# Patient Record
Sex: Male | Born: 1980 | Hispanic: No | Smoking: Never smoker
Health system: Southern US, Community
[De-identification: ages and names within clinical notes are randomized; demographics above are authoritative.]

## PROBLEM LIST (undated history)

## (undated) DIAGNOSIS — K8020 Calculus of gallbladder without cholecystitis without obstruction: Secondary | ICD-10-CM

## (undated) DIAGNOSIS — R519 Headache, unspecified: Secondary | ICD-10-CM

## (undated) DIAGNOSIS — K219 Gastro-esophageal reflux disease without esophagitis: Secondary | ICD-10-CM

## (undated) DIAGNOSIS — F419 Anxiety disorder, unspecified: Secondary | ICD-10-CM

## (undated) DIAGNOSIS — R51 Headache: Secondary | ICD-10-CM

## (undated) DIAGNOSIS — K8010 Calculus of gallbladder with chronic cholecystitis without obstruction: Secondary | ICD-10-CM

## (undated) DIAGNOSIS — E669 Obesity, unspecified: Secondary | ICD-10-CM

## (undated) DIAGNOSIS — I10 Essential (primary) hypertension: Secondary | ICD-10-CM

## (undated) HISTORY — PX: EYE SURGERY: SHX253

## (undated) HISTORY — PX: APPENDECTOMY: SHX54

## (undated) HISTORY — DX: Essential (primary) hypertension: I10

## (undated) HISTORY — PX: HERNIA REPAIR: SHX51

---

## 2008-08-04 ENCOUNTER — Emergency Department (HOSPITAL_COMMUNITY): Admission: EM | Admit: 2008-08-04 | Discharge: 2008-08-04 | Payer: Self-pay | Admitting: *Deleted

## 2010-11-10 LAB — URINALYSIS, ROUTINE W REFLEX MICROSCOPIC
Bilirubin Urine: NEGATIVE
Ketones, ur: NEGATIVE mg/dL
Nitrite: NEGATIVE
Protein, ur: NEGATIVE mg/dL
Urobilinogen, UA: 1 mg/dL (ref 0.0–1.0)

## 2017-03-25 ENCOUNTER — Emergency Department (HOSPITAL_BASED_OUTPATIENT_CLINIC_OR_DEPARTMENT_OTHER)
Admission: EM | Admit: 2017-03-25 | Discharge: 2017-03-25 | Disposition: A | Discharge status: Home / Self Care | Payer: Self-pay | Attending: Emergency Medicine | Admitting: Emergency Medicine

## 2017-03-25 ENCOUNTER — Encounter (HOSPITAL_BASED_OUTPATIENT_CLINIC_OR_DEPARTMENT_OTHER): Payer: Self-pay | Admitting: *Deleted

## 2017-03-25 DIAGNOSIS — R300 Dysuria: Secondary | ICD-10-CM | POA: Insufficient documentation

## 2017-03-25 LAB — URINALYSIS, ROUTINE W REFLEX MICROSCOPIC
Bilirubin Urine: NEGATIVE
Glucose, UA: NEGATIVE mg/dL
Hgb urine dipstick: NEGATIVE
Ketones, ur: NEGATIVE mg/dL
LEUKOCYTES UA: NEGATIVE
Nitrite: NEGATIVE
PROTEIN: NEGATIVE mg/dL
SPECIFIC GRAVITY, URINE: 1.025 (ref 1.005–1.030)
pH: 6.5 (ref 5.0–8.0)

## 2017-03-25 NOTE — ED Provider Notes (Signed)
Emergency Department Provider Note   I have reviewed the triage vital signs and the nursing notes.   HISTORY  Chief Complaint Dysuria   HPI Cody Stevenson is a 36 y.o. male without a significant past medical history the presents to the emergency department for 3 hours of dysuria. Patient states she's had this one time before that went away after a day. This episode started one elevated interview. Is not sexually active. Does not have any change in the color of his urine or the smell of his urine. No other associated symptoms no modifying symptoms. No back pain. No history of kidney stones. He did have hematuria earlier in the day but not currently. No GI symptoms such as vomiting or diarrhea.   History reviewed. No pertinent past medical history.  There are no active problems to display for this patient.   Past Surgical History:  Procedure Laterality Date  . APPENDECTOMY    . HERNIA REPAIR        Allergies Patient has no known allergies.  No family history on file.  Social History Social History  Substance Use Topics  . Smoking status: Never Smoker  . Smokeless tobacco: Not on file  . Alcohol use No    Review of Systems  All other systems negative except as documented in the HPI. All pertinent positives and negatives as reviewed in the HPI.  ____________________________________________   PHYSICAL EXAM:  VITAL SIGNS: ED Triage Vitals  Enc Vitals Group     BP 03/25/17 1400 (!) 160/95     Pulse Rate 03/25/17 1400 96     Resp 03/25/17 1400 16     Temp 03/25/17 1400 98.7 F (37.1 C)     Temp src --      SpO2 03/25/17 1400 98 %     Weight 03/25/17 1358 (!) 310 lb (140.6 kg)     Height 03/25/17 1358 5\' 10"  (1.778 m)     Head Circumference --      Peak Flow --      Pain Score 03/25/17 1358 4     Pain Loc --      Pain Edu? --      Excl. in GC? --     Constitutional: Alert and oriented. Well appearing and in no acute distress. Eyes: Conjunctivae are  normal. PERRL. EOMI. Head: Atraumatic. Nose: No congestion/rhinnorhea. Mouth/Throat: Mucous membranes are moist.  Oropharynx non-erythematous. Neck: No stridor.  No meningeal signs.   Cardiovascular: Normal rate, regular rhythm. Good peripheral circulation. Grossly normal heart sounds.   Respiratory: Normal respiratory effort.  No retractions. Lungs CTAB. Gastrointestinal: Soft and nontender. No distention.  Musculoskeletal: No lower extremity tenderness nor edema. No gross deformities of extremities. Neurologic:  Normal speech and language. No gross focal neurologic deficits are appreciated.  Skin:  Skin is warm, dry and intact. No rash noted.   ____________________________________________   LABS (all labs ordered are listed, but only abnormal results are displayed)  Labs Reviewed  URINALYSIS, ROUTINE W REFLEX MICROSCOPIC  GC/CHLAMYDIA PROBE AMP ([place]) NOT AT [facility]   ____________________________________________   PROCEDURES  Procedure(s) performed:   Procedures   ____________________________________________   INITIAL IMPRESSION / ASSESSMENT AND PLAN / ED COURSE  Pertinent labs & imaging results that were available during my care of the patient were reviewed by me and considered in my medical decision making (see chart for details).  UA negative. Gonorrhea and chlamydia culture sent. Could also be a small kidney stone that he passed  unwillingly. Has seen a urologist before but no indication for imaging at this time. No indication for further workup either.  ____________________________________________  FINAL CLINICAL IMPRESSION(S) / ED DIAGNOSES  Final diagnoses:  Dysuria    MEDICATIONS GIVEN DURING THIS VISIT:  Medications - No data to display   NEW OUTPATIENT MEDICATIONS STARTED DURING THIS VISIT:  There are no discharge medications for this patient.   Note:  This document was prepared using Dragon voice recognition software and may include  unintentional dictation errors.    Marily MemosMesner, Dazha Kempa, MD 03/25/17 2308

## 2017-03-25 NOTE — ED Notes (Signed)
ED Provider at bedside. 

## 2017-03-25 NOTE — ED Triage Notes (Signed)
Pt c/o painful urination x 1 day 

## 2017-03-26 LAB — GC/CHLAMYDIA PROBE AMP (~~LOC~~) NOT AT ARMC
Chlamydia: NEGATIVE
NEISSERIA GONORRHEA: NEGATIVE

## 2017-09-10 ENCOUNTER — Encounter: Payer: Self-pay | Admitting: Physician Assistant

## 2017-09-10 ENCOUNTER — Other Ambulatory Visit: Payer: Self-pay

## 2017-09-10 ENCOUNTER — Ambulatory Visit (INDEPENDENT_AMBULATORY_CARE_PROVIDER_SITE_OTHER): Payer: Managed Care, Other (non HMO) | Admitting: Physician Assistant

## 2017-09-10 VITALS — BP 138/82 | HR 101 | Temp 98.0°F | Resp 18 | Ht 69.69 in | Wt 325.4 lb

## 2017-09-10 DIAGNOSIS — R030 Elevated blood-pressure reading, without diagnosis of hypertension: Secondary | ICD-10-CM | POA: Diagnosis not present

## 2017-09-10 DIAGNOSIS — R040 Epistaxis: Secondary | ICD-10-CM | POA: Diagnosis not present

## 2017-09-10 DIAGNOSIS — H6123 Impacted cerumen, bilateral: Secondary | ICD-10-CM | POA: Diagnosis not present

## 2017-09-10 MED ORDER — OMRON 7 SERIES BP MONITOR DEVI: 1.0000 | Freq: Every day | 0 refills | Status: DC

## 2017-09-10 NOTE — Progress Notes (Signed)
Bella KennedyBrian Digiacomo  MRN: [id number] DOB: [birth year]  PCP: Morrell RiddleWeber, Leronda Lewers L, PA-C  Chief Complaint  Patient presents with  . Hypertension    checking blood pressure     Subjective:  Pt presents to clinic for concerns about his BP.  He has had problems with BP in the past and when it gets high he has nose bleeds.  In the past he has been on medication but he lost weight and was able to stop his medication - he has gained all the weight back.  He checks his at pharmacies - 140s/80s but not when he feels bad or is actively having the nose bleeds.  Last nose bleed was a couple of weeks ago that started about a month prior to that he ha.s had about 4-5 in total and seems to always be from the right nare  Labs about a year ago - all good to his memory but that was before weight gain.  History is obtained by patient.  Review of Systems  Constitutional: Negative for chills and fever.  HENT: Positive for congestion (recent) and nosebleeds.   Eyes: Negative for visual disturbance.  Respiratory: Negative for cough and shortness of breath.   Cardiovascular: Negative.  Negative for chest pain, palpitations and leg swelling.  Allergic/Immunologic: Negative for environmental allergies.  Neurological: Negative for dizziness, light-headedness and headaches.    There are no active problems to display for this patient.   No current outpatient medications on file prior to visit.   No current facility-administered medications on file prior to visit.     No Known Allergies  Past Medical History:  Diagnosis Date  . 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 Smoking status: Never Smoker  . Smokeless tobacco: Never Used  Substance Use Topics  . Alcohol use: Yes    Comment: social monthly  . Drug use: No   family history includes Heart attack in his maternal  grandmother and mother; Heart disease in his maternal grandmother.     Objective:  BP 138/82   Pulse (!) 101   Temp 98 F (36.7 C) (Oral)   Resp 18   Ht 5' 9.69" (1.77 m)   Wt (!) 325 lb 6.4 oz (147.6 kg)   SpO2 95%   BMI 47.11 kg/m  Body mass index is 47.11 kg/m.  Physical Exam  Constitutional: He is oriented to person, place, and time and well-developed, well-nourished, and in no distress.  HENT:  Head: Normocephalic and atraumatic.  Right Ear: Hearing, tympanic membrane and external ear normal. A foreign body (cerumen impaction) is present.  Left Ear: Hearing, tympanic membrane and external ear normal. A foreign body (cerumen impaction) is present.  Nose: Mucosal edema (red) present. No nasal deformity or septal deviation.  Mouth/Throat: Uvula is midline, oropharynx is clear and moist and mucous membranes are normal.  Eyes: Conjunctivae are normal.  Neck: Normal range of motion.  Cardiovascular: Normal rate, regular rhythm, normal heart sounds and intact distal pulses.  Pulmonary/Chest: Effort normal and breath sounds normal. He has no wheezes.  Musculoskeletal:       Right lower leg: He exhibits no edema.       Left lower leg: He exhibits no edema.  Lymphadenopathy:       Head (right side): No tonsillar adenopathy present.  Head (left side): No tonsillar adenopathy present.    He has no cervical adenopathy.       Right: No supraclavicular adenopathy present.       Left: No supraclavicular adenopathy present.  Neurological: He is alert and oriented to person, place, and time. Gait normal.  Skin: Skin is warm and dry.  Psychiatric: Mood, memory, affect and judgment normal.    Assessment and Plan :  Elevated BP without diagnosis of hypertension - Plan: Blood Pressure Monitoring (OMRON 7 SERIES BP MONITOR) DEVI - BP currently is good.  ? His readings being high due to improper cuff size.  I also want recordings when he is feeling his BP is high.  He will get a monitor  and keep track.  He will recheck with me in a month - fasting for a CPE where we will recheck this and start medications if indicated.  He will watch his salt and try to reduce caloric intake - a lot of his meals are fast food due to his job and current lifestyle.  Bilateral impacted cerumen - d/w pt how to start treatment of this at home  Epistaxis - nasal saline and humidifer - I am not sure what is causing this at this time - but there is a possibility of environmental causes and this should help with that  Benny Lennert PA-C  Primary Care at [facility] 09/10/2017 5:49 PM

## 2017-09-10 NOTE — Patient Instructions (Addendum)
Get a BP cuff - take it when you have either nose bleeds or with the sensation that your BP is higher like when you get dizzy.  Saline spray up nose and cool mist humidifer for environmental nose bleeds   IF you received an x-ray today, you will receive an invoice from [facility]. Please contact [facility] at [phone] with questions or concerns regarding your invoice.   IF you received labwork today, you will receive an invoice from [facility]. Please contact [facility] at [phone] with questions or concerns regarding your invoice.   Our billing staff will not be able to assist you with questions regarding bills from these companies.  You will be contacted with the lab results as soon as they are available. The fastest way to get your results is to activate your My Chart account. Instructions are located on the last page of this paperwork. If you have not heard from us regarding the results in 2 weeks, please contact this office.    Hydrogen peroxide and then hot shower water to ear  Or olive oil and then heat to start to break down the wax  Earwax Buildup, Adult The ears produce a substance called earwax that helps keep bacteria out of the ear and protects the skin in the ear canal. Occasionally, earwax can build up in the ear and cause discomfort or hearing loss. What increases the risk? This condition is more likely to develop in people who:  Are male.  Are elderly.  Naturally produce more earwax.  Clean their ears often with cotton swabs.  Use earplugs often.  Use in-ear headphones often.  Wear hearing aids.  Have narrow ear canals.  Have earwax that is overly thick or sticky.  Have eczema.  Are dehydrated.  Have excess hair in the ear canal.  What are the signs or symptoms? Symptoms of this condition include:  Reduced or muffled hearing.  A feeling of fullness in the ear or feeling that the ear is plugged.  Fluid coming from the  ear.  Ear pain.  Ear itch.  Ringing in the ear.  Coughing.  An obvious piece of earwax that can be seen inside the ear canal.  How is this diagnosed? This condition may be diagnosed based on:  Your symptoms.  Your medical history.  An ear exam. During the exam, your health care provider will look into your ear with an instrument called an otoscope.  You may have tests, including a hearing test. How is this treated? This condition may be treated by:  Using ear drops to soften the earwax.  Having the earwax removed by a health care provider. The health care provider may: ? Flush the ear with water. ? Use an instrument that has a loop on the end (curette). ? Use a suction device.  Surgery to remove the wax buildup. This may be done in severe cases.  Follow these instructions at home:  Take over-the-counter and prescription medicines only as told by your health care provider.  Do not put any objects, including cotton swabs, into your ear. You can clean the opening of your ear canal with a washcloth or facial tissue.  Follow instructions from your health care provider about cleaning your ears. Do not over-clean your ears.  Drink enough fluid to keep your urine clear or pale yellow. This will help to thin the earwax.  Keep all follow-up visits as told by your health care provider. If earwax builds up in your ears often  or if you use hearing aids, consider seeing your health care provider for routine, preventive ear cleanings. Ask your health care provider how often you should schedule your cleanings.  If you have hearing aids, clean them according to instructions from the manufacturer and your health care provider. Contact a health care provider if:  You have ear pain.  You develop a fever.  You have blood, pus, or other fluid coming from your ear.  You have hearing loss.  You have ringing in your ears that does not go away.  Your symptoms do not improve with  treatment.  You feel like the room is spinning (vertigo). Summary  Earwax can build up in the ear and cause discomfort or hearing loss.  The most common symptoms of this condition include reduced or muffled hearing and a feeling of fullness in the ear or feeling that the ear is plugged.  This condition may be diagnosed based on your symptoms, your medical history, and an ear exam.  This condition may be treated by using ear drops to soften the earwax or by having the earwax removed by a health care provider.  Do not put any objects, including cotton swabs, into your ear. You can clean the opening of your ear canal with a washcloth or facial tissue. This information is not intended to replace advice given to you by your health care provider. Make sure you discuss any questions you have with your health care provider. Document Released: 08/20/2004 Document Revised: 09/23/2016 Document Reviewed: 09/23/2016 Elsevier Interactive Patient Education  [employer].

## 2017-10-08 ENCOUNTER — Encounter: Payer: Self-pay | Admitting: Physician Assistant

## 2017-10-08 ENCOUNTER — Other Ambulatory Visit: Payer: Self-pay

## 2017-10-08 ENCOUNTER — Ambulatory Visit (INDEPENDENT_AMBULATORY_CARE_PROVIDER_SITE_OTHER): Payer: Managed Care, Other (non HMO) | Admitting: Physician Assistant

## 2017-10-08 VITALS — BP 130/90 | HR 98 | Temp 97.5°F | Resp 18 | Ht 69.49 in | Wt 311.8 lb

## 2017-10-08 DIAGNOSIS — R030 Elevated blood-pressure reading, without diagnosis of hypertension: Secondary | ICD-10-CM

## 2017-10-08 DIAGNOSIS — R358 Other polyuria: Secondary | ICD-10-CM | POA: Diagnosis not present

## 2017-10-08 DIAGNOSIS — Z131 Encounter for screening for diabetes mellitus: Secondary | ICD-10-CM | POA: Diagnosis not present

## 2017-10-08 DIAGNOSIS — Z13220 Encounter for screening for lipoid disorders: Secondary | ICD-10-CM

## 2017-10-08 DIAGNOSIS — Z1329 Encounter for screening for other suspected endocrine disorder: Secondary | ICD-10-CM

## 2017-10-08 DIAGNOSIS — L83 Acanthosis nigricans: Secondary | ICD-10-CM

## 2017-10-08 DIAGNOSIS — Z130 Encounter for screening for diseases of the blood and blood-forming organs and certain disorders involving the immune mechanism: Secondary | ICD-10-CM | POA: Diagnosis not present

## 2017-10-08 DIAGNOSIS — E66813 Obesity, class 3: Secondary | ICD-10-CM

## 2017-10-08 DIAGNOSIS — Z114 Encounter for screening for human immunodeficiency virus [HIV]: Secondary | ICD-10-CM

## 2017-10-08 DIAGNOSIS — E559 Vitamin D deficiency, unspecified: Secondary | ICD-10-CM | POA: Diagnosis not present

## 2017-10-08 DIAGNOSIS — Z0000 Encounter for general adult medical examination without abnormal findings: Secondary | ICD-10-CM | POA: Diagnosis not present

## 2017-10-08 DIAGNOSIS — R21 Rash and other nonspecific skin eruption: Secondary | ICD-10-CM

## 2017-10-08 DIAGNOSIS — R3589 Other polyuria: Secondary | ICD-10-CM

## 2017-10-08 DIAGNOSIS — Z6841 Body Mass Index (BMI) 40.0 and over, adult: Secondary | ICD-10-CM

## 2017-10-08 DIAGNOSIS — Z13228 Encounter for screening for other metabolic disorders: Secondary | ICD-10-CM

## 2017-10-08 DIAGNOSIS — L819 Disorder of pigmentation, unspecified: Secondary | ICD-10-CM

## 2017-10-08 NOTE — Patient Instructions (Addendum)
Use Selsun Blue for rash on back and Lamisil cream for scaly rash on feet.   Congratulations on your great progress on your weight loss journey! Keep up the good work.    IF you received an x-ray today, you will receive an invoice from [facility]. Please contact [facility] at [phone] with questions or concerns regarding your invoice.   IF you received labwork today, you will receive an invoice from [facility]. Please contact [facility] at [phone] with questions or concerns regarding your invoice.   Our billing staff will not be able to assist you with questions regarding bills from these companies.  You will be contacted with the lab results as soon as they are available. The fastest way to get your results is to activate your My Chart account. Instructions are located on the last page of this paperwork. If you have not heard from us regarding the results in 2 weeks, please contact this office.

## 2017-10-08 NOTE — Progress Notes (Signed)
Cody Stevenson  MRN: [id number] DOB: September 07, 1980  PCP: Mancel Bale, PA-C  Subjective:  Pt presents to clinic for a CPE. Patient has lost 14 lbs in on month since last visit on 09/10/2017 due to diet and exercise changes. Patient reports a vitamin D deficiency last time he had a physical, "a couple years ago."  Last dental exam: Yesterday (10/07/2017,) patient saw Dr. Trenton Gammon. At this visit, patient had three fillings done.  Last vision exam: Patient states was about 2 years ago. Patient will update this within the next month.   Vaccinations      Tetanus:Patient states was last updated 5 years ago. Patient will get results to [place].       HIV screening: Patient cannot remember when this was last updated. Will be updated at this visit.  Typical meals for patient: Ground beef with green peas was dinner last night. A typical dinner consists of chicken fajitas, and other mexican-style dishes. Patient started a new eating plan 2 weeks ago that reduces his carbohydrate intake.   Typical beverage choices: water.  Exercises: 10 times per week for 1 hour. This increase in exercise has been implemented for 14 days.  Sleeps: 7 hrs per night and sleeping well   Patient Active Problem List   Diagnosis Date Noted  . Obesity, Class III, BMI 40-49.9 (morbid obesity) ([place]) 10/11/2017    Review of Systems  Constitutional: Positive for activity change. Negative for appetite change, chills, diaphoresis, fatigue, fever and unexpected weight change.  HENT: Positive for dental problem (Just had three fillings yesterday. Upcoming appointment for another filling and a root canal on April 17th.). Negative for congestion, ear discharge, ear pain, hearing loss, postnasal drip, rhinorrhea, sinus pressure, sinus pain, sneezing, sore throat and tinnitus.   Eyes: Positive for photophobia (Patient's norm for right eye that has had surgery. ) and visual disturbance (Diplopia in right eye after right eye surgery 20 years  ago.). Negative for pain, discharge, redness and itching.  Respiratory: Negative.  Negative for cough, chest tightness, shortness of breath and wheezing.   Cardiovascular: Negative.  Negative for chest pain and palpitations.  Gastrointestinal: Negative.  Negative for abdominal pain, anal bleeding, constipation, diarrhea, nausea and vomiting.  Endocrine: Positive for polydipsia and polyuria.  Genitourinary: Positive for frequency. Negative for difficulty urinating, dysuria, hematuria, penile swelling, scrotal swelling and urgency.  Musculoskeletal: Positive for back pain. Negative for arthralgias, gait problem, joint swelling, myalgias, neck pain and neck stiffness.  Skin: Negative.  Negative for color change, rash and wound.  Allergic/Immunologic: Positive for environmental allergies. Negative for food allergies and immunocompromised state.       Patient notices intermittent allergies since moving to [place] from [facility]. Allergy symptoms not bothering him today.  Neurological: Negative.  Negative for dizziness, tremors, weakness, light-headedness, numbness and headaches.  Psychiatric/Behavioral: Negative for decreased concentration, dysphoric mood and sleep disturbance. The patient is nervous/anxious (Patient is seeing therapist for "mild anxiety.").      Prior to Admission medications   Medication Sig Start Date End Date Taking? Authorizing Provider  Blood Pressure Monitoring (OMRON 7 SERIES BP MONITOR) DEVI 1 Device by Does not apply route daily. 09/10/17  Yes Liborio Saccente, Damaris Hippo, PA-C     No Known Allergies  Social History   Socioeconomic History  . Marital status: Divorced    Spouse name: None  . Number of children: None  . Years of education: None  . Highest education level: None  Social Needs  . Emergency planning/management officer  strain: None  . Food insecurity - worry: None  . Food insecurity - inability: None  . Transportation needs - medical: None  . Transportation needs - non-medical: None    Occupational History  . Occupation: [employer]   Tobacco Use  . Smoking status: Never Smoker  . Smokeless tobacco: Never Used  Substance and Sexual Activity  . Alcohol use: Yes    Alcohol/week: 0.6 oz    Types: 1 Standard drinks or equivalent per week    Comment: social monthly  . Drug use: No  . Sexual activity: Not Currently    Birth control/protection: None  Other Topics Concern  . 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   No children    Past Surgical History:  Procedure Laterality Date  . APPENDECTOMY    . EYE SURGERY    . HERNIA REPAIR      Family History  Problem Relation Age of Onset  . Heart attack Mother        died 77  . Obesity Brother   . Heart disease Maternal Grandmother   . Heart attack Maternal Grandmother      Objective:  BP 130/90   Pulse 98   Temp (!) 97.5 F (36.4 C) (Oral)   Resp 18   Ht 5' 9.49" (1.765 m)   Wt (!) 311 lb 12.8 oz (141.4 kg)   SpO2 96%   BMI 45.40 kg/m   Physical Exam  Constitutional: He is oriented to person, place, and time and well-developed, well-nourished, and in no distress.  BP (!) 132/94   Pulse 98   Temp (!) 97.5 F (36.4 C) (Oral)   Resp 18   Ht 5' 9.49" (1.765 m)   Wt (!) 311 lb 12.8 oz (141.4 kg)   SpO2 96%   BMI 45.40 kg/m   HENT:  Head: Normocephalic and atraumatic.  Right Ear: External ear normal.  Left Ear: External ear normal.  Nose: Nose normal.  Mouth/Throat: Oropharynx is clear and moist.  Over production of ear wax, bilaterally.  Eyes: Conjunctivae and EOM are normal. Pupils are equal, round, and reactive to light.  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 Neck supple. No thyromegaly present.  Cardiovascular: Normal rate, regular rhythm, normal heart sounds and intact distal pulses. Exam reveals no gallop and no friction rub.  No murmur heard. Pulmonary/Chest: Effort normal and breath sounds  normal.  Abdominal: Soft. Bowel sounds are normal. He exhibits no distension and no mass. There is no tenderness. There is no rebound and no guarding.  Musculoskeletal: Normal range of motion. He exhibits no edema, tenderness or deformity.  Neurological: He is alert and oriented to person, place, and time. He has normal reflexes. Gait normal. GCS score is 15.  Skin: Skin is warm and dry. No rash noted. No erythema. No pallor.  Entire back with areas of hyper and hypopigmentation consistent with tinea versicolor. Dry, scaly skin on bottom of feet and between toes bilaterally. Dark, velvety skin around neck consistent with acanthosis nigricans.  Psychiatric: Mood, memory, affect and judgment normal.    Wt Readings from Last 3 Encounters:  10/08/17 (!) 311 lb 12.8 oz (141.4 kg)  09/10/17 (!) 325 lb 6.4 oz (147.6 kg)  03/25/17 (!) 310 lb (140.6 kg)     Visual Acuity Screening   Right eye Left eye Both eyes  Without correction:  With correction: 20/100 20/20 20/20    Assessment and Plan :  Physical exam, annual  Class 3 severe obesity due to excess calories without serious comorbidity with body mass index (BMI) of 45.0 to 49.9 in adult [facility])  Screening cholesterol level - Plan: Lipid panel  Screening for deficiency anemia - Plan: CBC with Differential/Platelet  Screening for metabolic disorder - Plan: CMP14+EGFR  Screening for thyroid disorder - Plan: TSH  Screening for diabetes mellitus - Plan: Hemoglobin A1c  Vitamin D deficiency - Plan: VITAMIN D 25 Hydroxy (Vit-D Deficiency, Fractures)  Polyuria - Plan: POCT urinalysis dipstick  Screening for HIV (human immunodeficiency virus) - Plan: HIV antibody  Acanthosis nigricans  Elevated BP without diagnosis of hypertension - Plan: Measure blood pressure  Rash - likely tinea pedis - use OTC lamisil - keep feet dry  Hypopigmentation - likely tinea versicolor- pt will get OTC selsum blue and use 2-3x/week to help  this  Obesity, Class III, BMI 40-49.9 (morbid obesity) ([place]) -continue great weight loss success  Labs will be reviewed once resulted. Appropriate anticipatory guidance given. Congratulated patient on weight loss and encouraged him to continue with the positive lifestyle changes he has made.  Follow up to be determined based on lab results.   Windell Hummingbird PA-C  [facility] at [facility] 10/11/2017 1:42 PM

## 2017-10-09 LAB — CMP14+EGFR
A/G RATIO: 1.8 (ref 1.2–2.2)
ALBUMIN: 4.9 g/dL (ref 3.5–5.5)
ALK PHOS: 101 IU/L (ref 39–117)
ALT: 54 IU/L — ABNORMAL HIGH (ref 0–44)
AST: 34 IU/L (ref 0–40)
BILIRUBIN TOTAL: 0.6 mg/dL (ref 0.0–1.2)
BUN / CREAT RATIO: 13 (ref 9–20)
BUN: 11 mg/dL (ref 6–20)
CHLORIDE: 104 mmol/L (ref 96–106)
CO2: 18 mmol/L — ABNORMAL LOW (ref 20–29)
Calcium: 9.5 mg/dL (ref 8.7–10.2)
Creatinine, Ser: 0.82 mg/dL (ref 0.76–1.27)
GFR calc non Af Amer: 112 mL/min/{1.73_m2} (ref >59)
GFR, EST AFRICAN AMERICAN: 130 mL/min/{1.73_m2} (ref >59)
GLOBULIN, TOTAL: 2.8 g/dL (ref 1.5–4.5)
Glucose: 101 mg/dL — ABNORMAL HIGH (ref 65–99)
POTASSIUM: 4.5 mmol/L (ref 3.5–5.2)
SODIUM: 142 mmol/L (ref 134–144)
TOTAL PROTEIN: 7.7 g/dL (ref 6.0–8.5)

## 2017-10-09 LAB — CBC WITH DIFFERENTIAL/PLATELET
BASOS ABS: 0 10*3/uL (ref 0.0–0.2)
BASOS: 1 %
EOS (ABSOLUTE): 0.1 10*3/uL (ref 0.0–0.4)
Eos: 2 %
HEMOGLOBIN: 16.7 g/dL (ref 13.0–17.7)
Hematocrit: 50.2 % (ref 37.5–51.0)
IMMATURE GRANULOCYTES: 0 %
Immature Grans (Abs): 0 10*3/uL (ref 0.0–0.1)
Lymphocytes Absolute: 2.2 10*3/uL (ref 0.7–3.1)
Lymphs: 39 %
MCH: 29.4 pg (ref 26.6–33.0)
MCHC: 33.3 g/dL (ref 31.5–35.7)
MCV: 88 fL (ref 79–97)
MONOCYTES: 8 %
Monocytes Absolute: 0.4 10*3/uL (ref 0.1–0.9)
NEUTROS ABS: 2.9 10*3/uL (ref 1.4–7.0)
NEUTROS PCT: 50 %
PLATELETS: 311 10*3/uL (ref 150–379)
RBC: 5.68 x10E6/uL (ref 4.14–5.80)
RDW: 14.5 % (ref 12.3–15.4)
WBC: 5.7 10*3/uL (ref 3.4–10.8)

## 2017-10-09 LAB — LIPID PANEL
CHOLESTEROL TOTAL: 145 mg/dL (ref 100–199)
Chol/HDL Ratio: 4.7 ratio (ref 0.0–5.0)
HDL: 31 mg/dL — AB (ref >39)
LDL CALC: 85 mg/dL (ref 0–99)
Triglycerides: 147 mg/dL (ref 0–149)
VLDL CHOLESTEROL CAL: 29 mg/dL (ref 5–40)

## 2017-10-09 LAB — HEMOGLOBIN A1C
Est. average glucose Bld gHb Est-mCnc: 126 mg/dL
Hgb A1c MFr Bld: 6 % — ABNORMAL HIGH (ref 4.8–5.6)

## 2017-10-09 LAB — VITAMIN D 25 HYDROXY (VIT D DEFICIENCY, FRACTURES): VIT D 25 HYDROXY: 10.8 ng/mL — AB (ref 30.0–100.0)

## 2017-10-09 LAB — TSH: TSH: 1.74 u[IU]/mL (ref 0.450–4.500)

## 2017-10-09 LAB — HIV ANTIBODY (ROUTINE TESTING W REFLEX): HIV Screen 4th Generation wRfx: NONREACTIVE

## 2017-10-11 DIAGNOSIS — R030 Elevated blood-pressure reading, without diagnosis of hypertension: Secondary | ICD-10-CM | POA: Insufficient documentation

## 2017-10-17 ENCOUNTER — Encounter: Payer: Self-pay | Admitting: Physician Assistant

## 2018-01-27 ENCOUNTER — Encounter: Payer: Self-pay | Admitting: Physician Assistant

## 2018-02-02 ENCOUNTER — Other Ambulatory Visit: Payer: Self-pay

## 2018-02-02 ENCOUNTER — Ambulatory Visit (INDEPENDENT_AMBULATORY_CARE_PROVIDER_SITE_OTHER): Payer: Managed Care, Other (non HMO) | Admitting: Physician Assistant

## 2018-02-02 ENCOUNTER — Encounter: Payer: Self-pay | Admitting: Physician Assistant

## 2018-02-02 VITALS — BP 130/80 | HR 109 | Temp 99.2°F | Resp 18 | Ht 69.49 in | Wt 314.4 lb

## 2018-02-02 DIAGNOSIS — K644 Residual hemorrhoidal skin tags: Secondary | ICD-10-CM | POA: Diagnosis not present

## 2018-02-02 DIAGNOSIS — R030 Elevated blood-pressure reading, without diagnosis of hypertension: Secondary | ICD-10-CM

## 2018-02-02 MED ORDER — HYDROCORTISONE 2.5 % RE CREA: 1.0000 "application " | TOPICAL_CREAM | Freq: Two times a day (BID) | RECTAL | 1 refills | Status: DC

## 2018-02-02 NOTE — Progress Notes (Signed)
Cody KennedyBrian Sobalvarro  MRN: [id number] DOB: Feb 12, 1981  PCP: Morrell RiddleWeber, Sarah L, PA-C  Chief Complaint  Patient presents with  . Rectal Problems    having pain around rectal area but not when using the bathroom     Subjective:  Pt presents to clinic for rectal pain 3 weeks that has not been changed.  The pain occurs when he wipes at the most posterior aspect of his anus.  He gets pain sometimes with sitting.  He has no pain with passing a stool.  Today he noticed after passing a stool some small blood on the tissue.  He has done nothing for this. Patient has been checking his blood pressure at home and his readings have been in the high 130s over 90s  This started the day after he had an MRI on his back - he has been diagnosed with spondilolithesis.     History is obtained by patient.  Review of Systems  Gastrointestinal: Positive for anal bleeding (On toilet tissue) and rectal pain. Negative for blood in stool, constipation and diarrhea.    Patient Active Problem List   Diagnosis Date Noted  . Obesity, Class III, BMI 40-49.9 (morbid obesity) (HCC) 10/11/2017  . Elevated BP without diagnosis of hypertension 10/11/2017    Current Outpatient Medications on File Prior to Visit  Medication Sig Dispense Refill  . Blood Pressure Monitoring (OMRON 7 SERIES BP MONITOR) DEVI 1 Device by Does not apply route daily. (Patient not taking: Reported on 02/02/2018) 1 Device 0   No current facility-administered medications on file prior to visit.     No Known Allergies  Past Medical History:  Diagnosis Date  . 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   No children   Social History   Tobacco Use  . Smoking status: Never Smoker  . Smokeless tobacco: Never Used  Substance Use Topics  . Alcohol use: Yes    Alcohol/week: 0.6 oz    Types: 1 Standard drinks or equivalent per week    Comment: social monthly    . Drug use: No   family history includes Heart attack in his maternal grandmother and mother; Heart disease in his maternal grandmother; Obesity in his brother.     Objective:  BP 130/80   Pulse (!) 109   Temp 99.2 F (37.3 C) (Oral)   Resp 18   Ht 5' 9.49" (1.765 m)   Wt (!) 314 lb 6.4 oz (142.6 kg)   SpO2 95%   BMI 45.78 kg/m  Body mass index is 45.78 kg/m.  Wt Readings from Last 3 Encounters:  02/02/18 (!) 314 lb 6.4 oz (142.6 kg)  10/08/17 (!) 311 lb 12.8 oz (141.4 kg)  09/10/17 (!) 325 lb 6.4 oz (147.6 kg)    Physical Exam  Constitutional: He is oriented to person, place, and time. He appears well-developed and well-nourished.  HENT:  Head: Normocephalic and atraumatic.  Right Ear: External ear normal.  Left Ear: External ear normal.  Eyes: Conjunctivae are normal.  Neck: Normal range of motion.  Cardiovascular: Normal rate, regular rhythm and normal heart sounds.  No murmur heard. Pulmonary/Chest: Effort normal and breath sounds normal. He has no wheezes.  Abdominal: Soft.  Genitourinary: Rectal exam shows external hemorrhoid.     Neurological: He is alert and oriented to person, place, and time.  Skin: Skin is warm and dry.  Psychiatric: Judgment normal.  Vitals reviewed.   Assessment and Plan :  External hemorrhoid - Plan: hydrocortisone (ANUSOL-HC) 2.5 % rectal cream -discussed with patient hemorrhoids and I suspect this is his discomfort  Elevated BP without diagnosis of hypertension -blood pressure is better today but his home readings are still elevated he will continue home readings for a week each month until I recheck him back he will do readings in the morning of the first upon awakening and again prior to bedtime.  Patient verbalized to me that they understand the following: diagnosis, what is being done for them, what to expect and what should be done at home.  Their questions have been answered.  See after visit summary for patient specific  instructions.  Benny Lennert PA-C  [facility] at [facility] 02/02/2018 5:46 PM  Please note: Portions of this report may have been transcribed using dragon voice recognition software. Every effort was made to ensure accuracy; however, inadvertent computerized transcription errors may be present.

## 2018-02-02 NOTE — Patient Instructions (Addendum)
Hemorrhoids Hemorrhoids are swollen veins in and around the rectum or anus. There are two types of hemorrhoids:  Internal hemorrhoids. These occur in the veins that are just inside the rectum. They may poke through to the outside and become irritated and painful.  External hemorrhoids. These occur in the veins that are outside of the anus and can be felt as a painful swelling or hard lump near the anus.  Most hemorrhoids do not cause serious problems, and they can be managed with home treatments such as diet and lifestyle changes. If home treatments do not help your symptoms, procedures can be done to shrink or remove the hemorrhoids. What are the causes? This condition is caused by increased pressure in the anal area. This pressure may result from various things, including:  Constipation.  Straining to have a bowel movement.  Diarrhea.  Pregnancy.  Obesity.  Sitting for long periods of time.  Heavy lifting or other activity that causes you to strain.  Anal sex.  What are the signs or symptoms? Symptoms of this condition include:  Pain.  Anal itching or irritation.  Rectal bleeding.  Leakage of stool (feces).  Anal swelling.  One or more lumps around the anus.  How is this diagnosed? This condition can often be diagnosed through a visual exam. Other exams or tests may also be done, such as:  Examination of the rectal area with a gloved hand (digital rectal exam).  Examination of the anal canal using a small tube (anoscope).  A blood test, if you have lost a significant amount of blood.  A test to look inside the colon (sigmoidoscopy or colonoscopy).  How is this treated? This condition can usually be treated at home. However, various procedures may be done if dietary changes, lifestyle changes, and other home treatments do not help your symptoms. These procedures can help make the hemorrhoids smaller or remove them completely. Some of these procedures involve  surgery, and others do not. Common procedures include:  Rubber band ligation. Rubber bands are placed at the base of the hemorrhoids to cut off the blood supply to them.  Sclerotherapy. Medicine is injected into the hemorrhoids to shrink them.  Infrared coagulation. A type of light energy is used to get rid of the hemorrhoids.  Hemorrhoidectomy surgery. The hemorrhoids are surgically removed, and the veins that supply them are tied off.  Stapled hemorrhoidopexy surgery. A circular stapling device is used to remove the hemorrhoids and use staples to cut off the blood supply to them.  Follow these instructions at home: Eating and drinking  Eat foods that have a lot of fiber in them, such as whole grains, beans, nuts, fruits, and vegetables. Ask your health care provider about taking products that have added fiber (fiber supplements).  Drink enough fluid to keep your urine clear or pale yellow. Managing pain and swelling  Take warm sitz baths for 20 minutes, 3-4 times a day to ease pain and discomfort.  If directed, apply ice to the affected area. Using ice packs between sitz baths may be helpful. ? Put ice in a plastic bag. ? Place a towel between your skin and the bag. ? Leave the ice on for 20 minutes, 2-3 times a day. General instructions  Take over-the-counter and prescription medicines only as told by your health care provider.  Use medicated creams or suppositories as told.  Exercise regularly.  Go to the bathroom when you have the urge to have a bowel movement. Do not wait.    Avoid straining to have bowel movements.  Keep the anal area dry and clean. Use wet toilet paper or moist towelettes after a bowel movement.  Do not sit on the toilet for long periods of time. This increases blood pooling and pain. Contact a health care provider if:  You have increasing pain and swelling that are not controlled by treatment or medicine.  You have uncontrolled bleeding.  You  have difficulty having a bowel movement, or you are unable to have a bowel movement.  You have pain or inflammation outside the area of the hemorrhoids. This information is not intended to replace advice given to you by your health care provider. Make sure you discuss any questions you have with your health care provider. Document Released: 07/10/2000 Document Revised: 12/11/2015 Document Reviewed: 03/27/2015 Elsevier Interactive Patient Education  2018 [employer]     IF you received an x-ray today, you will receive an invoice from [facility]. Please contact [facility] at [phone] with questions or concerns regarding your invoice.   IF you received labwork today, you will receive an invoice from [facility]. Please contact [facility] at [phone] with questions or concerns regarding your invoice.   Our billing staff will not be able to assist you with questions regarding bills from these companies.  You will be contacted with the lab results as soon as they are available. The fastest way to get your results is to activate your My Chart account. Instructions are located on the last page of this paperwork. If you have not heard from us regarding the results in 2 weeks, please contact this office.

## 2018-03-11 ENCOUNTER — Ambulatory Visit: Payer: Self-pay

## 2018-03-11 NOTE — Telephone Encounter (Signed)
Patient called in with c/o "rectal pain/bleeding." He says "I was in the office to see Ms. Weber and she told me I have hemorrhoids. I felt pressure down there and went to the bathroom. I had a bowel movement and it was constipated. I wiped and there was blood on the tissue. I still feel like there is something there and it's painful, I can hardly sit. The pain is a 7 on the pain scale. There is itching as well." I asked about other symptoms, he denies. According to protocol, see PCP within 24 hours, no availability with PCP, appointment scheduled for tomorrow at 0820 with Dr. Alwyn RenHopper, care advice given, patient verbalized understanding.   Reason for Disposition . MODERATE-SEVERE rectal pain (i.e., interferes with school, work, or sleep)  Answer Assessment - Initial Assessment Questions 1. SYMPTOM:  "What's the main symptom you're concerned about?" (e.g., pain, itching, swelling, rash)     Rectal pain 2. ONSET: "When did the pain start?"     Worse today 3. RECTAL PAIN: "Do you have any pain around your rectum?" "How bad is the pain?"  (Scale 1-10; or mild, moderate, severe)  - MILD (1-3): doesn't interfere with normal activities   - MODERATE (4-7): interferes with normal activities or awakens from sleep, limping   - SEVERE (8-10): excruciating pain, unable to have a bowel movement      7 4. RECTAL ITCHING: "Do you have any itching in this area?" "How bad is the itching?"  (Scale 1-10; or mild, moderate, severe)  - MILD - doesn't interfere with normal activities   - MODERATE - SEVERE: interferes with normal activities or awakens from sleep     Moderate-Severe 5. CONSTIPATION: "Do you have constipation?" If so, "How bad is it?"     Yes, had BM today 6. CAUSE: "What do you think is causing the anus symptoms?"     I don't know 7. OTHER SYMPTOMS: "Do you have any other symptoms?"  (e.g., rectal bleeding, abdominal pain, vomiting, fever)     Rectal bleeding on the tissue 8. PREGNANCY: "Is there  any chance you are pregnant?" "When was your last menstrual period?"     N/A  Protocols used: [facility]

## 2018-03-12 ENCOUNTER — Encounter: Payer: Self-pay | Admitting: Family Medicine

## 2018-03-12 ENCOUNTER — Ambulatory Visit (INDEPENDENT_AMBULATORY_CARE_PROVIDER_SITE_OTHER): Payer: Managed Care, Other (non HMO) | Admitting: Family Medicine

## 2018-03-12 ENCOUNTER — Other Ambulatory Visit: Payer: Self-pay

## 2018-03-12 VITALS — BP 144/97 | HR 85 | Temp 97.9°F | Ht 70.0 in | Wt 323.8 lb

## 2018-03-12 DIAGNOSIS — K644 Residual hemorrhoidal skin tags: Secondary | ICD-10-CM | POA: Diagnosis not present

## 2018-03-12 DIAGNOSIS — K6289 Other specified diseases of anus and rectum: Secondary | ICD-10-CM | POA: Diagnosis not present

## 2018-03-12 DIAGNOSIS — K625 Hemorrhage of anus and rectum: Secondary | ICD-10-CM

## 2018-03-12 MED ORDER — HYDROCORTISONE ACETATE 25 MG RE SUPP: 25.0000 mg | Freq: Two times a day (BID) | RECTAL | 0 refills | Status: DC

## 2018-03-12 NOTE — Progress Notes (Signed)
Patient ID: Cody KennedyBrian Stevenson, male    DOB: 05/16/1981  Age: 37 y.o. MRN: [id number]  Chief Complaint  Patient presents with  . Rectal Pain    rectal pain with bleeding for the past month, using Hydrocortisone with no resolve    Subjective:    Current allergies, medications, problem list, past/family and social histories reviewed.  Objective:  BP (!) 144/97 (BP Location: Left Arm, Patient Position: Sitting, Cuff Size: Large)   Pulse 85   Temp 97.9 F (36.6 C) (Oral)   Ht 5\' 10"  (1.778 m)   Wt (!) 323 lb 12.8 oz (146.9 kg)   SpO2 95%   BMI 46.5746 kg/m   37 year old man with a history of over the last couple of months having rectal pain and bleeding.  He was previously evaluated by Cody LennertSarah Stevenson and treated with Anusol HC.  The patient has been using it a couple times a day.  He persists with bleeding with BMs.  There is blood on the surface of the stool but not enough to fill the toilet bowl with blood there is pain with defecation or contact to the anal area.  He has back issues and is started doing some physical therapy with a little bit of straining but not heavy weight lifting.  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  He is not sexually involved.  He had not had these kind of problems in the past until the last couple months.  Objective: No major acute distress.  Overweight male.  Alert and oriented.  Abdomen soft without mass or tenderness.  Anal exam reveals some external hemorrhoidal tissue, with particularly one larger nodule a little under a centimeter in diameter.  It is not currently inflamed or tender or rale.  The surrounding tissue has irregular folds which appear to have had some old hemorrhoidal disease.  There is one tiny little red spot but it is not bleeding at this time.  Assessment & Plan:   Assessment: 1. Residual hemorrhoidal skin tags   2. Rectal bleeding   3. Rectal pain       Plan: Anal suppositories.  Will make referral to gastroenterology.  Orders  Placed This Encounter  Procedures  . Ambulatory referral to Gastroenterology    Referral Priority:   Routine    Referral Type:   Consultation    Referral Reason:   Specialty Services Required    Number of Visits Requested:   1    Meds ordered this encounter  Medications  . hydrocortisone (ANUSOL-HC) 25 MG suppository    Sig: Place 1 suppository (25 mg total) rectally 2 (two) times daily.    Dispense:  12 suppository    Refill:  0         Patient Instructions    Use the cortisone suppositories twice daily in the morning and evening as directed  Continue using the hemorrhoidal cream twice daily  Referral is being made to gastroenterology and someone should contact you regarding that next week.  Call back if further concerns or if you do not hear from the referrals desk.   If you have lab work done today you will be contacted with your lab results within the next 2 weeks.  If you have not heard from us then please contact us. The fastest way to get your results is to register for My Chart.   IF you received an x-ray today, you will receive an invoice from [facility]. Please contact [facility]  at [phone] with questions or concerns regarding your invoice.   IF you received labwork today, you will receive an invoice from [facility]. Please contact [facility] at [phone] with questions or concerns regarding your invoice.   Our billing staff will not be able to assist you with questions regarding bills from these companies.  You will be contacted with the lab results as soon as they are available. The fastest way to get your results is to activate your My Chart account. Instructions are located on the last page of this paperwork. If you have not heard from us regarding the results in 2 weeks, please contact this office.        Return if symptoms worsen or fail to improve.   Janace Hoardavid Hopper, MD 03/12/2018

## 2018-03-12 NOTE — Patient Instructions (Addendum)
  Use the cortisone suppositories twice daily in the morning and evening as directed  Continue using the hemorrhoidal cream twice daily  Referral is being made to gastroenterology and someone should contact you regarding that next week.  Call back if further concerns or if you do not hear from the referrals desk.   If you have lab work done today you will be contacted with your lab results within the next 2 weeks.  If you have not heard from us then please contact us. The fastest way to get your results is to register for My Chart.   IF you received an x-ray today, you will receive an invoice from [facility]. Please contact [facility] at [phone] with questions or concerns regarding your invoice.   IF you received labwork today, you will receive an invoice from [facility]. Please contact [facility] at [phone] with questions or concerns regarding your invoice.   Our billing staff will not be able to assist you with questions regarding bills from these companies.  You will be contacted with the lab results as soon as they are available. The fastest way to get your results is to activate your My Chart account. Instructions are located on the last page of this paperwork. If you have not heard from us regarding the results in 2 weeks, please contact this office.

## 2018-03-17 ENCOUNTER — Telehealth: Payer: Self-pay | Admitting: Physician Assistant

## 2018-03-17 NOTE — Telephone Encounter (Signed)
Called and spoke with pt. Rescheduled her appt with Benny LennertSarah Weber to 9/12 at 1100 am - Advised of time, building number and late policy.

## 2018-03-28 ENCOUNTER — Emergency Department (HOSPITAL_COMMUNITY): Payer: Managed Care, Other (non HMO)

## 2018-03-28 ENCOUNTER — Other Ambulatory Visit: Payer: Self-pay

## 2018-03-28 ENCOUNTER — Encounter (HOSPITAL_COMMUNITY): Payer: Self-pay | Admitting: Emergency Medicine

## 2018-03-28 ENCOUNTER — Emergency Department (HOSPITAL_COMMUNITY)
Admission: EM | Admit: 2018-03-28 | Discharge: 2018-03-28 | Disposition: A | Discharge status: Home / Self Care | Payer: Managed Care, Other (non HMO) | Attending: Emergency Medicine | Admitting: Emergency Medicine

## 2018-03-28 DIAGNOSIS — R1011 Right upper quadrant pain: Secondary | ICD-10-CM

## 2018-03-28 DIAGNOSIS — I10 Essential (primary) hypertension: Secondary | ICD-10-CM | POA: Diagnosis not present

## 2018-03-28 DIAGNOSIS — K8020 Calculus of gallbladder without cholecystitis without obstruction: Secondary | ICD-10-CM | POA: Diagnosis not present

## 2018-03-28 DIAGNOSIS — Z79899 Other long term (current) drug therapy: Secondary | ICD-10-CM | POA: Diagnosis not present

## 2018-03-28 LAB — COMPREHENSIVE METABOLIC PANEL
ALK PHOS: 125 U/L (ref 38–126)
ALT: 252 U/L — AB (ref 0–44)
AST: 286 U/L — AB (ref 15–41)
Albumin: 4.1 g/dL (ref 3.5–5.0)
Anion gap: 12 (ref 5–15)
BILIRUBIN TOTAL: 1.9 mg/dL — AB (ref 0.3–1.2)
BUN: 9 mg/dL (ref 6–20)
CALCIUM: 9.5 mg/dL (ref 8.9–10.3)
CHLORIDE: 104 mmol/L (ref 98–111)
CO2: 24 mmol/L (ref 22–32)
CREATININE: 0.77 mg/dL (ref 0.61–1.24)
Glucose, Bld: 110 mg/dL — ABNORMAL HIGH (ref 70–99)
Potassium: 4.1 mmol/L (ref 3.5–5.1)
Sodium: 140 mmol/L (ref 135–145)
TOTAL PROTEIN: 7.7 g/dL (ref 6.5–8.1)

## 2018-03-28 LAB — CBC
HEMATOCRIT: 49.2 % (ref 39.0–52.0)
Hemoglobin: 16.7 g/dL (ref 13.0–17.0)
MCH: 29.7 pg (ref 26.0–34.0)
MCHC: 33.9 g/dL (ref 30.0–36.0)
MCV: 87.5 fL (ref 78.0–100.0)
PLATELETS: 306 10*3/uL (ref 150–400)
RBC: 5.62 MIL/uL (ref 4.22–5.81)
RDW: 14.1 % (ref 11.5–15.5)
WBC: 9.3 10*3/uL (ref 4.0–10.5)

## 2018-03-28 LAB — I-STAT TROPONIN, ED: Troponin i, poc: 0.02 ng/mL (ref 0.00–0.08)

## 2018-03-28 LAB — LIPASE, BLOOD: LIPASE: 26 U/L (ref 11–51)

## 2018-03-28 MED ORDER — ONDANSETRON 4 MG PO TBDP: 4.0000 mg | ORAL_TABLET | Freq: Three times a day (TID) | ORAL | 0 refills | Status: DC | PRN

## 2018-03-28 MED ORDER — IOPAMIDOL (ISOVUE-300) INJECTION 61%
100.0000 mL | Freq: Once | INTRAVENOUS | Status: AC | PRN
  Administered 2018-03-28: 100 mL via INTRAVENOUS

## 2018-03-28 MED ORDER — HYDROCODONE-ACETAMINOPHEN 5-325 MG PO TABS: 1.0000 | ORAL_TABLET | Freq: Four times a day (QID) | ORAL | 0 refills | Status: DC | PRN

## 2018-03-28 MED ORDER — IOPAMIDOL (ISOVUE-300) INJECTION 61%
INTRAVENOUS | Status: AC
  Filled 2018-03-28: qty 100

## 2018-03-28 NOTE — ED Notes (Signed)
ED Provider at bedside. 

## 2018-03-28 NOTE — Discharge Instructions (Signed)
Your ultrasound today revealed that you have gallstones in your gallbladder.  It is advised that you follow-up with a general surgeon to discuss elective cholecystectomy (gallbladder removal).  Avoid fried, fatty, greasy foods as this may cause recurrence of your pain.  Should you experience severe pain, we recommend that you take Norco as prescribed.  Do not drive or drink alcohol after taking this medication as it may make you drowsy and impair your judgment.  You have been prescribed Zofran to take as needed for nausea.  You may return for new or concerning symptoms.

## 2018-03-28 NOTE — ED Notes (Signed)
Patient transported to Ultrasound 

## 2018-03-28 NOTE — ED Provider Notes (Signed)
[facility] EMERGENCY DEPARTMENT Provider Note   CSN: [id number] Arrival date & time: 03/28/18  1725     History   Chief Complaint Chief Complaint  Patient presents with  . Abdominal Pain  . Chest Pain    HPI Cody Stevenson is a 37 y.o. male with a history of morbid obesity and hypertension who presents to the emergency department with a chief complaint of abdominal pain.  The patient endorses sudden onset, constant upper abdominal pain that radiated up into the chest that began at approximately 3 PM while he was sitting at work.  He reports associated nausea and 3-4 episodes of nonbloody, nonbilious emesis, and dizziness.  He states that he characterizes the pain as burning or pulling.  He states that he had some mild dyspnea around the time he was vomiting that has since improved.  He denies fever, chills, diarrhea, constipation, leg swelling, or back pain.  He reports that his last episode of emesis was at approximately 16:00.  His pain has significantly improved since onset without treatment.  Currently rates his pain is 1 out of 10.  No treatment prior to arrival.  He reports that his diet has been much worse over the holiday weekend, and he has been eating much more fatty foods that he normally does.  He denies recent alcohol use.  Denies IV or recreational drug use.  Surgical history includes appendectomy. No known sick contacts.   The history is provided by the patient. No language interpreter was used.    Past Medical History:  Diagnosis Date  . Hypertension     Patient Active Problem List   Diagnosis Date Noted  . Obesity, Class III, BMI 40-49.9 (morbid obesity) (HCC) 10/11/2017  . Elevated BP without diagnosis of hypertension 10/11/2017    Past Surgical History:  Procedure Laterality Date  . APPENDECTOMY    . EYE SURGERY    . HERNIA REPAIR        Home Medications    Prior to Admission medications   Medication Sig Start Date End Date  Taking? Authorizing Provider  Blood Pressure Monitoring (OMRON 7 SERIES BP MONITOR) DEVI 1 Device by Does not apply route daily. 09/10/17   Weber, Dema Severin, PA-C  gabapentin (NEURONTIN) 300 MG capsule TK 1 C PO BID 01/14/18   [provider]  hydrocortisone (ANUSOL-HC) 2.5 % rectal cream Place 1 application rectally 2 (two) times daily. 02/02/18   Weber, Dema Severin, PA-C  hydrocortisone (ANUSOL-HC) 25 MG suppository Place 1 suppository (25 mg total) rectally 2 (two) times daily. 03/12/18   Peyton Najjar, MD    Family History Family History  Problem Relation Age of Onset  . Heart attack Mother        died 34  . Obesity Brother   . Heart disease Maternal Grandmother   . Heart attack Maternal Grandmother     Social History Social History   Tobacco Use  . Smoking status: Never Smoker  . Smokeless tobacco: Never Used  Substance Use Topics  . Alcohol use: Yes    Alcohol/week: 1.0 standard drinks    Types: 1 Standard drinks or equivalent per week    Comment: social monthly  . Drug use: No     Allergies   Patient has no known allergies.   Review of Systems Review of Systems  Constitutional: Negative for appetite change, chills and fever.  HENT: Negative for congestion.   Eyes: Negative for visual disturbance.  Respiratory: Positive for shortness  of breath.   Cardiovascular: Negative for chest pain.  Gastrointestinal: Positive for abdominal pain, nausea and vomiting. Negative for anal bleeding, blood in stool, constipation and diarrhea.  Genitourinary: Negative for dysuria, flank pain, penile pain and urgency.  Musculoskeletal: Negative for back pain.  Skin: Negative for rash.  Allergic/Immunologic: Negative for immunocompromised state.  Neurological: Positive for dizziness. Negative for headaches.  Psychiatric/Behavioral: Negative for confusion.     Physical Exam Updated Vital Signs BP 140/65   Pulse 91   Temp 98.1 F (36.7 C) (Oral)   Resp (!) 23   SpO2 98%    Physical Exam  Constitutional: He appears well-developed.  Non-toxic appearance. He does not appear ill. No distress.  Morbidly obese male.   HENT:  Head: Normocephalic.  Eyes: Conjunctivae are normal.  Neck: Neck supple. No JVD present.  Cardiovascular: Normal rate, regular rhythm, normal heart sounds and intact distal pulses. Exam reveals no gallop and no friction rub.  No murmur heard. Pulmonary/Chest: Effort normal. No stridor. No respiratory distress. He has no wheezes. He has no rales. He exhibits no tenderness.  Abdominal: Soft. He exhibits no distension.  Obese abdomen.  Exam is limited secondary to body habitus.  Abdomen is soft, nondistended, nontender.  Negative Murphy sign.  No CVA tenderness bilaterally.  No peritoneal signs.  Musculoskeletal: He exhibits no edema, tenderness or deformity.  Lymphadenopathy:    He has no cervical adenopathy.  Neurological: He is alert.  Skin: Skin is warm and dry. He is not diaphoretic.  Psychiatric: His behavior is normal.  Nursing note and vitals reviewed.    ED Treatments / Results  Labs (all labs ordered are listed, but only abnormal results are displayed) Labs Reviewed  COMPREHENSIVE METABOLIC PANEL - Abnormal; Notable for the following components:      Result Value   Glucose, Bld 110 (*)    AST 286 (*)    ALT 252 (*)    Total Bilirubin 1.9 (*)    All other components within normal limits  CBC  LIPASE, BLOOD  HEPATITIS PANEL, ACUTE  I-STAT TROPONIN, ED    EKG None  Radiology Dg Chest 2 View  Result Date: 03/28/2018 CLINICAL DATA:  Patient with chest pain, nausea and vomiting. EXAM: CHEST - 2 VIEW COMPARISON:  None. FINDINGS: Normal cardiac and mediastinal contours. No consolidative pulmonary opacities. No pleural effusion or pneumothorax. Regional skeleton is unremarkable. IMPRESSION: No acute cardiopulmonary process. Electronically Signed   By: Annia Belt M.D.   On: 03/28/2018 18:16   Ct Abdomen Pelvis W  Contrast  Result Date: 03/28/2018 CLINICAL DATA:  Right upper quadrant pain EXAM: CT ABDOMEN AND PELVIS WITH CONTRAST TECHNIQUE: Multidetector CT imaging of the abdomen and pelvis was performed using the standard protocol following bolus administration of intravenous contrast. CONTRAST:  ISOVUE-300 IOPAMIDOL (ISOVUE-300) INJECTION 61% COMPARISON:  Chest x-ray 03/28/2018 FINDINGS: Lower chest: No acute abnormality. Hepatobiliary: Steatosis. Possible gas containing gallstone. Suggestion of minimal gallbladder wall thickening on coronal views. No biliary dilatation Pancreas: Unremarkable. No pancreatic ductal dilatation or surrounding inflammatory changes. Spleen: Normal in size without focal abnormality. Adrenals/Urinary Tract: Adrenal glands are unremarkable. Kidneys are normal, without renal calculi, focal lesion, or hydronephrosis. Bladder is unremarkable. Stomach/Bowel: Stomach is within normal limits. Status post appendectomy. No evidence of bowel wall thickening, distention, or inflammatory changes. Vascular/Lymphatic: No significant vascular findings are present. No enlarged abdominal or pelvic lymph nodes. Reproductive: Prostate is unremarkable. Other: No abdominal wall hernia or abnormality. No abdominopelvic ascites. Musculoskeletal: Trace retrolisthesis  L4 on L5 with degenerative changes. 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 Moderate-to-marked degenerative changes at L5-S1. IMPRESSION: 1. Possible gas containing gallstone with suggestion of mild wall thickening. Suggest correlation with right upper quadrant ultrasound. 2. Hepatic steatosis Electronically Signed   By: Jasmine Pang M.D.   On: 03/28/2018 22:07   US Abdomen Limited Ruq  Result Date: 03/28/2018 CLINICAL DATA:  Right upper quadrant pain EXAM: ULTRASOUND ABDOMEN LIMITED RIGHT UPPER QUADRANT COMPARISON:  03/28/2018 CT FINDINGS: Gallbladder: A 1 cm calculus is identified near the neck of the gallbladder. The  gallbladder is not distended and there is no wall thickening or pericholecystic fluid. No sonographic Murphy sign noted by the technologist. Common bile duct: Diameter: Normal at 4 mm Liver: Coarsened echogenic appearance of the liver likely representing steatosis. Portal vein is patent on color Doppler imaging with normal direction of blood flow towards the liver. IMPRESSION: Hepatic steatosis.  Uncomplicated cholelithiasis. Electronically Signed   By: Tollie Eth M.D.   On: 03/28/2018 22:59    Procedures Procedures (including critical care time)  Medications Ordered in ED Medications  iopamidol (ISOVUE-300) 61 % injection (has no administration in time range)  iopamidol (ISOVUE-300) 61 % injection 100 mL (100 mLs Intravenous Contrast Given 03/28/18 2149)     Initial Impression / Assessment and Plan / ED Course  I have reviewed the triage vital signs and the nursing notes.  Pertinent labs & imaging results that were available during my care of the patient were reviewed by me and considered in my medical decision making (see chart for details).     37 year old male with a history of morbid obesity and hypertension presenting with upper abdominal pain, nausea, vomiting, and dizziness, onset this afternoon.  Symptoms are much improved since onset without treatment. Chest x-ray and troponin ordered by triage are unremarkable.  Doubt ACS or cardiac etiology at this time. Labs are notable for AST 286, ALT 252, and total bilirubin 1.9.  These are acutely elevated since 3/19. CT A/P with possible gas containing gallstone with suggestion of mild wall thickening. Correlation with RUQ [place] is recommended, which is pending. Patient care transferred to [facility] at the end of my shift. Patient presentation, ED course, and plan of care discussed with review of all pertinent labs and imaging. Please see his/her note for further details regarding further ED course and appropriate disposition.   Final Clinical  Impressions(s) / ED Diagnoses   Final diagnoses:  RUQ abdominal pain    ED Discharge Orders    None       Barkley Boards, PA-C 03/28/18 2304    Linwood Dibbles, MD 03/30/18 [phone]

## 2018-03-28 NOTE — ED Provider Notes (Signed)
11:20 PM Patient care assumed from [facility], PA-C at change of shift.  37 year old male presenting for upper abdominal pain radiating into his chest.  This was associated with vomiting.  Pain has spontaneously subsided.  On my assessment, patient has no complaints of pain.  He is resting comfortably.  He was awaiting ultrasound at change of shift which reveals uncomplicated cholelithiasis.  No pericholecystic fluid or gallbladder wall thickening to suggest acute cholecystitis.  LFTs today are slightly elevated consistent with episode of biliary colic.  The patient is afebrile and without leukocytosis.  Believe he is stable for follow-up with his primary care doctor as well as a general surgeon to discuss elective cholecystectomy.  The patient has been updated on results of ultrasound.  He verbalizes understanding as well as comfort with discharge.  Return precautions discussed and provided. Patient discharged in stable condition with no unaddressed concerns.   Antony Madura, PA-C 03/28/18 2322    Pricilla Loveless, MD 03/29/18 [phone]

## 2018-03-28 NOTE — ED Triage Notes (Signed)
Patient to ED c/o sudden onset chest/epigastric pain and abdominal pain while at work earlier today with N/V, shortness of breath, and lightheadedness. 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 Resp e/u, skin warm/dry. Denies fevers/chills.

## 2018-03-28 NOTE — ED Notes (Signed)
PT states understanding of care given, follow up care. PT ambulated from ED to car with a steady gait.  

## 2018-03-29 ENCOUNTER — Telehealth: Payer: Self-pay | Admitting: Physician Assistant

## 2018-03-29 ENCOUNTER — Encounter: Payer: Self-pay | Admitting: Physician Assistant

## 2018-03-29 ENCOUNTER — Encounter: Payer: Self-pay | Admitting: Gastroenterology

## 2018-03-29 NOTE — Telephone Encounter (Signed)
Please see note below. 

## 2018-03-29 NOTE — Telephone Encounter (Signed)
Copied from CRM [id number]. Topic: General - Other >> Mar 29, 2018 12:05 PM Stephannie Li, NT wrote: Reason for CRM: Patient is having surgery and would like to discuss this with Maralyn Sago as  soon as possible please call him [phone]  he would like to speak with her before scheduling if possible

## 2018-03-30 ENCOUNTER — Other Ambulatory Visit: Payer: Self-pay | Admitting: General Surgery

## 2018-03-30 NOTE — Telephone Encounter (Signed)
Contacted patient through [employer].  It is a good idea for his to have his gallbladder out.

## 2018-04-07 ENCOUNTER — Other Ambulatory Visit: Payer: Self-pay

## 2018-04-07 ENCOUNTER — Encounter: Payer: Self-pay | Admitting: Physician Assistant

## 2018-04-07 ENCOUNTER — Ambulatory Visit (INDEPENDENT_AMBULATORY_CARE_PROVIDER_SITE_OTHER): Payer: Managed Care, Other (non HMO) | Admitting: Physician Assistant

## 2018-04-07 VITALS — BP 128/85 | HR 91 | Temp 98.9°F | Resp 18 | Ht 70.0 in | Wt 319.6 lb

## 2018-04-07 DIAGNOSIS — M5442 Lumbago with sciatica, left side: Secondary | ICD-10-CM | POA: Diagnosis not present

## 2018-04-07 DIAGNOSIS — R7989 Other specified abnormal findings of blood chemistry: Secondary | ICD-10-CM

## 2018-04-07 DIAGNOSIS — K760 Fatty (change of) liver, not elsewhere classified: Secondary | ICD-10-CM | POA: Insufficient documentation

## 2018-04-07 DIAGNOSIS — G8929 Other chronic pain: Secondary | ICD-10-CM

## 2018-04-07 DIAGNOSIS — R030 Elevated blood-pressure reading, without diagnosis of hypertension: Secondary | ICD-10-CM

## 2018-04-07 DIAGNOSIS — Z6841 Body Mass Index (BMI) 40.0 and over, adult: Secondary | ICD-10-CM

## 2018-04-07 DIAGNOSIS — R945 Abnormal results of liver function studies: Secondary | ICD-10-CM

## 2018-04-07 NOTE — Progress Notes (Signed)
Cody KennedyBrian Stevenson  MRN: [id number] DOB: [birth year]  PCP: Morrell RiddleWeber, Tausha Milhoan L, PA-C  Chief Complaint  Patient presents with  . Follow-up    Subjective:  Pt presents to clinic for general recheck.  He had an emergency department visit for gallbladder and has gallbladder surgery schedule for 9/20.  He has been eating very carefully since his episode of pain that ended him up in the ED 9/2.  He is overall feeling well.  History is obtained by patient.  Review of Systems  Constitutional: Negative for chills and fever.  Eyes: Negative for visual disturbance.  Respiratory: Negative for cough and shortness of breath.   Cardiovascular: Negative for chest pain, palpitations and leg swelling.  Gastrointestinal: Negative for abdominal pain and diarrhea.  Neurological: Negative for dizziness, light-headedness and headaches.    Patient Active Problem List   Diagnosis Date Noted  . Hepatic steatosis 04/07/2018  . Obesity, Class III, BMI 40-49.9 (morbid obesity) (HCC) 10/11/2017  . Elevated BP without diagnosis of hypertension 10/11/2017    Current Outpatient Medications on File Prior to Visit  Medication Sig Dispense Refill  . Blood Pressure Monitoring (OMRON 7 SERIES BP MONITOR) DEVI 1 Device by Does not apply route daily. (Patient not taking: Reported on 04/11/2018) 1 Device 0  . HYDROcodone-acetaminophen (NORCO/VICODIN) 5-325 MG tablet Take 1-2 tablets by mouth every 6 (six) hours as needed for severe pain. (Patient not taking: Reported on 04/11/2018) 12 tablet 0  . hydrocortisone (ANUSOL-HC) 2.5 % rectal cream Place 1 application rectally 2 (two) times daily. (Patient taking differently: Place 1 application rectally daily. ) 30 g 1  . hydrocortisone (ANUSOL-HC) 25 MG suppository Place 1 suppository (25 mg total) rectally 2 (two) times daily. (Patient not taking: Reported on 04/11/2018) 12 suppository 0  . ondansetron (ZOFRAN ODT) 4 MG disintegrating tablet Take 1 tablet (4 mg total) by mouth every 8  (eight) hours as needed for nausea or vomiting. (Patient not taking: Reported on 04/11/2018) 10 tablet 0   No current facility-administered medications on file prior to visit.     No Known Allergies  Past Medical History:  Diagnosis Date  . 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   No children   Social History   Tobacco Use  . Smoking status: Never Smoker  . Smokeless tobacco: Never Used  Substance Use Topics  . Alcohol use: Yes    Alcohol/week: 1.0 standard drinks    Types: 1 Standard drinks or equivalent per week    Comment: social monthly  . Drug use: No   family history includes Heart attack in his maternal grandmother and mother; Heart disease in his maternal grandmother; Obesity in his brother.     Objective:  BP 128/85   Pulse 91   Temp 98.9 F (37.2 C) (Oral)   Resp 18   Ht 5\' 10"  (1.778 m)   Wt (!) 319 lb 9.6 oz (145 kg)   SpO2 96%   BMI 45.86 kg/m  Body mass index is 45.86 kg/m.  Wt Readings from Last 3 Encounters:  04/07/18 (!) 319 lb 9.6 oz (145 kg)  03/12/18 (!) 323 lb 12.8 oz (146.9 kg)  02/02/18 (!) 314 lb 6.4 oz (142.6 kg)    Physical Exam  Constitutional: He is oriented to person, place, and time.  HENT:  Head: Normocephalic and atraumatic.  Right Ear: External ear normal.  Left Ear: External ear  normal.  Eyes: Conjunctivae are normal.  Neck: Normal range of motion.  Cardiovascular: Normal rate, regular rhythm and normal heart sounds.  No murmur heard. Pulmonary/Chest: Effort normal and breath sounds normal. He has no wheezes.  Neurological: He is alert and oriented to person, place, and time.  Skin: Skin is warm and dry.  Psychiatric: Judgment normal.    Assessment and Plan :  Elevated BP without diagnosis of hypertension - Plan: Referral to Nutrition and Diabetes Services  Hepatic steatosis - Plan: Referral to Nutrition and Diabetes  Services  Class 3 severe obesity due to excess calories without serious comorbidity with body mass index (BMI) of 45.0 to 49.9 in adult [facility]) - Plan: Referral to Nutrition and Diabetes Services  Chronic left-sided low back pain with left-sided sciatica - Plan: Referral to Nutrition and Diabetes Services  Elevated LFTs - Plan: Hepatic Function Panel   Patient's blood pressure has remained stable and normal without medications.  His weight fluctuates significantly.  Referral to nutrition services.  Check LFTs as this has been elevated at his ED visit expect this to be closer to normal now that he is having no gallstone blocking.  Patient verbalized to me that they understand the following: diagnosis, what is being done for them, what to expect and what should be done at home.  Their questions have been answered.  See after visit summary for patient specific instructions.  Benny Lennert PA-C  Primary Care at [facility] 04/12/2018 10:19 PM  Please note: Portions of this report may have been transcribed using dragon voice recognition software. Every effort was made to ensure accuracy; however, inadvertent computerized transcription errors may be present.

## 2018-04-07 NOTE — Patient Instructions (Addendum)
   [facility] - [address], [place], [place] [address] Phone: [phone]   If you have lab work done today you will be contacted with your lab results within the next 2 weeks.  If you have not heard from us then please contact us. The fastest way to get your results is to register for My Chart.   IF you received an x-ray today, you will receive an invoice from [facility]. Please contact  Radiology at [phone] with questions or concerns regarding your invoice.   IF you received labwork today, you will receive an invoice from [facility]. Please contact [facility] at [phone] with questions or concerns regarding your invoice.   Our billing staff will not be able to assist you with questions regarding bills from these companies.  You will be contacted with the lab results as soon as they are available. The fastest way to get your results is to activate your My Chart account. Instructions are located on the last page of this paperwork. If you have not heard from us regarding the results in 2 weeks, please contact this office.     

## 2018-04-08 LAB — HEPATIC FUNCTION PANEL
ALBUMIN: 4.5 g/dL (ref 3.5–5.5)
ALT: 80 IU/L — ABNORMAL HIGH (ref 0–44)
AST: 22 IU/L (ref 0–40)
Alkaline Phosphatase: 116 IU/L (ref 39–117)
BILIRUBIN TOTAL: 0.8 mg/dL (ref 0.0–1.2)
Bilirubin, Direct: 0.24 mg/dL (ref 0.00–0.40)
TOTAL PROTEIN: 7.2 g/dL (ref 6.0–8.5)

## 2018-04-08 NOTE — H&P (Signed)
Cody KennedyBrian Stevenson [facility] Patient #: [id number] DOB: Dec 05, 1980 Single / Language: [place] / Race: White Male        History of Present Illness       . This is a 37 y.o man referred by Dr. Pricilla LovelessScott Stevenson in [facility] for management of gallstones and elevated LFT's. His PCP is Cody RomansSara Stevenson, [place].      Over the past year he has had some episodes of upped abdominal pain, nausea and vomiting. No serious. Noted onset severe upper abdominal pain, nausea and vomiting on 03/28/2018 at work after breakfast. This was sudden onset and resolved 8-10 hours later while in ED.Denies food intolerances, diarhea, fever, chills, dysurea, jaundice, liver disease. Labs 9/3 showed AST 286, Alt 252, T.Bili 1.9. Other labs normal.CT suggested fatty liver and a gas containing gallstone. US showed a 1 cm. gallstone, CBD 4 mm, no inflammation.      PH-pyloromyotomy age 2 months. [place] HTN on no meds. BMI 46.R. eye lens surgery 1990. FH- GM had GB out. M deceased - MI. Father deceased age 38. SH-single no children. 3 roommates. works at [employer]. no tobacco. occas. ETOH.      I advised lap chole with IOC sooner rather than later. He agrees. I discussed indications, details, technique and risks in detail. He is aware of risk of bleeding, infection, conversion to open laparotomy, bile leak injury to adjacen organs, diarrhea, port site hernia, and other unforseen complications. Understands will need further intervention if CBD stones. He knows morbid obesity and prior surgery complicates this. He understands. All questiions answered. Her agrees with plan    Past Surgical History  Appendectomy   Diagnostic Studies History  Colonoscopy  never  Allergies  No Known Drug Allergies  Allergies Reconciled   Medication History  Vicodin (5-300MG  Tablet, Oral) Active. Ondansetron (4MG  Tablet, Oral) Active. Medications Reconciled  Social History  Alcohol use   Moderate alcohol use. Caffeine use  Tea. No drug use  Tobacco use  Never smoker.  Family History  Heart Disease  Mother. Heart disease in male family member before age 265   Other Problems  Anxiety Disorder  Cholelithiasis  Gastroesophageal Reflux Disease  Hemorrhoids     Review of Systems  Skin Not Present- Change in Wart/Mole, Dryness, Hives, Jaundice, New Lesions, Non-Healing Wounds, Rash and Ulcer. Respiratory Present- Snoring. Not Present- Bloody sputum, Chronic Cough, Difficulty Breathing and Wheezing. Cardiovascular Not Present- Chest Pain, Difficulty Breathing Lying Down, Leg Cramps, Palpitations, Rapid Heart Rate, Shortness of Breath and Swelling of Extremities. Gastrointestinal Present- Abdominal Pain, Hemorrhoids, Nausea and Vomiting. Not Present- Bloating, Bloody Stool, Change in Bowel Habits, Chronic diarrhea, Constipation, Difficulty Swallowing, Excessive gas, Gets full quickly at meals, Indigestion and Rectal Pain. Male Genitourinary Not Present- Blood in Urine, Change in Urinary Stream, Frequency, Impotence, Nocturia, Painful Urination, Urgency and Urine Leakage.  Vitals  Weight: 322.13 lb Height: 70in Body Surface Area: 2.56 m Body Mass Index: 46.22 kg/m  Temp.: 99.63F(Oral)  Pulse: 109 (Regular)  BP: 132/84 (Sitting, Left Arm, Standard)       Physical Exam  General Mental Status-Alert. General Appearance-Consistent with stated age. Hydration-Well hydrated. Voice-Normal. Note: large. BMI 46   Head and Neck Head-normocephalic, atraumatic with no lesions or palpable masses. Trachea-midline. Thyroid Gland Characteristics - normal size and consistency.  Eye Eyeball - Bilateral-Extraocular movements intact. Sclera/Conjunctiva - Bilateral-No scleral icterus.  Chest and Lung Exam Chest and lung exam reveals -quiet, even and  easy respiratory effort with no use of accessory muscles and on auscultation, normal  breath sounds, no adventitious sounds and normal vocal resonance. Inspection Chest Wall - Normal. Back - normal.  Cardiovascular Cardiovascular examination reveals -normal heart sounds, regular rate and rhythm with no murmurs and normal pedal pulses bilaterally.  Abdomen Inspection Inspection of the abdomen reveals - No Hernias. Skin - Scar - Note: transverse RUQ scar. Infraumbilical scar. no hernias. Palpation/Percussion Palpation and Percussion of the abdomen reveal - Soft, Non Tender, No Rebound tenderness, No Rigidity (guarding) and No hepatosplenomegaly. Auscultation Auscultation of the abdomen reveals - Bowel sounds normal.  Neurologic Neurologic evaluation reveals -alert and oriented x 3 with no impairment of recent or remote memory. Mental Status-Normal.  Musculoskeletal Normal Exam - Left-Upper Extremity Strength Normal and Lower Extremity Strength Normal. Normal Exam - Right-Upper Extremity Strength Normal and Lower Extremity Strength Normal.  Lymphatic Head & Neck  General Head & Neck Lymphatics: Bilateral - Description - Normal. Axillary  General Axillary Region: Bilateral - Description - Normal. Tenderness - Non Tender. Femoral & Inguinal  Generalized Femoral & Inguinal Lymphatics: Bilateral - Description - Normal. Tenderness - Non Tender.    Assessment & Plan  GALLSTONES (K80.20)    You have had a few episodes of upper abdominal pain nausea and vomiting over the past year This recent episode was more severe and you went to the emergency department Your CT scan suggested gallstones and a fatty liver, but no other significant findings were identified. You then had an ultrasound which showed a 1 cm gallstone but no signs of infection or blockage Some of her liver function tests were elevated and that is probably due to your gallbladder. This raises the concern for a common bile duct stone  you'll be scheduled for laparoscopic cholecystectomy with  cholangiogram, possible open cholecystectomy I discussed the indications, techniques, and risks of the surgery with you in detail Please read over the patient information booklet that we reviewed I recommend that we go ahead with the surgery in the near future due to your abnormal liver function tests  S/P PYLOROMYOTOMY, FOLLOW-UP EXAM (Z09) Impression: Right upper quadrant incision for vomiting milk. age 8 months. HISTORY OF LAPAROSCOPIC APPENDECTOMY (Z90.49) Impression: 2 years ago in [place] BMI 45.0-49.9, ADULT (Z68.42) BORDERLINE HYPERTENSION (R03.0)    Ellenore Roscoe M. Derrell Lolling, M.D., [facility] General and Minimally invasive Surgery Breast and Colorectal Surgery Office:   [phone] Pager:   [phone]

## 2018-04-14 ENCOUNTER — Other Ambulatory Visit: Payer: Self-pay

## 2018-04-14 ENCOUNTER — Encounter (HOSPITAL_COMMUNITY): Payer: Self-pay | Admitting: *Deleted

## 2018-04-14 MED ORDER — DEXTROSE 5 % IV SOLN
3.0000 g | INTRAVENOUS | Status: AC
  Administered 2018-04-15: 3 g via INTRAVENOUS
  Filled 2018-04-14: qty 3

## 2018-04-14 NOTE — Progress Notes (Signed)
Pt denies SOB, chest pain, and being under the care of a cardiologist. Pt denies having a stress test, echo and cardiac cath. Pt made aware to stop taking  Aspirin,vitamins, fish oil and herbal medications. Do not take any NSAIDs ie: Ibuprofen, Advil, Naproxen (Aleve), Motrin, BC and Goody Powder. Pt verbalized understanding of all pre-op instructions.

## 2018-04-15 ENCOUNTER — Ambulatory Visit (HOSPITAL_COMMUNITY)
Admission: RE | Admit: 2018-04-15 | Discharge: 2018-04-15 | Disposition: A | Discharge status: Home / Self Care | Payer: Managed Care, Other (non HMO) | Source: Ambulatory Visit | Attending: General Surgery | Admitting: General Surgery

## 2018-04-15 ENCOUNTER — Ambulatory Visit (HOSPITAL_COMMUNITY): Payer: Managed Care, Other (non HMO) | Admitting: Anesthesiology

## 2018-04-15 ENCOUNTER — Encounter (HOSPITAL_COMMUNITY): Payer: Self-pay | Admitting: Anesthesiology

## 2018-04-15 ENCOUNTER — Encounter (HOSPITAL_COMMUNITY)
Admission: RE | Disposition: A | Discharge status: Home / Self Care | Payer: Self-pay | Source: Ambulatory Visit | Attending: General Surgery

## 2018-04-15 ENCOUNTER — Ambulatory Visit (HOSPITAL_COMMUNITY): Payer: Managed Care, Other (non HMO)

## 2018-04-15 DIAGNOSIS — Z79899 Other long term (current) drug therapy: Secondary | ICD-10-CM | POA: Diagnosis not present

## 2018-04-15 DIAGNOSIS — Z79891 Long term (current) use of opiate analgesic: Secondary | ICD-10-CM | POA: Insufficient documentation

## 2018-04-15 DIAGNOSIS — K8010 Calculus of gallbladder with chronic cholecystitis without obstruction: Secondary | ICD-10-CM | POA: Diagnosis present

## 2018-04-15 DIAGNOSIS — E669 Obesity, unspecified: Secondary | ICD-10-CM | POA: Insufficient documentation

## 2018-04-15 DIAGNOSIS — Z419 Encounter for procedure for purposes other than remedying health state, unspecified: Secondary | ICD-10-CM

## 2018-04-15 DIAGNOSIS — Z6841 Body Mass Index (BMI) 40.0 and over, adult: Secondary | ICD-10-CM | POA: Insufficient documentation

## 2018-04-15 HISTORY — DX: Anxiety disorder, unspecified: F41.9

## 2018-04-15 HISTORY — DX: Calculus of gallbladder with chronic cholecystitis without obstruction: K80.10

## 2018-04-15 HISTORY — DX: Obesity, unspecified: E66.9

## 2018-04-15 HISTORY — DX: Calculus of gallbladder without cholecystitis without obstruction: K80.20

## 2018-04-15 HISTORY — PX: CHOLECYSTECTOMY: SHX55

## 2018-04-15 HISTORY — DX: Headache, unspecified: R51.9

## 2018-04-15 HISTORY — DX: Gastro-esophageal reflux disease without esophagitis: K21.9

## 2018-04-15 HISTORY — DX: Headache: R51

## 2018-04-15 LAB — COMPREHENSIVE METABOLIC PANEL
ALBUMIN: 3.7 g/dL (ref 3.5–5.0)
ALK PHOS: 85 U/L (ref 38–126)
ALT: 56 U/L — AB (ref 0–44)
AST: 34 U/L (ref 15–41)
Anion gap: 10 (ref 5–15)
BUN: 9 mg/dL (ref 6–20)
CALCIUM: 8.9 mg/dL (ref 8.9–10.3)
CO2: 23 mmol/L (ref 22–32)
CREATININE: 0.71 mg/dL (ref 0.61–1.24)
Chloride: 105 mmol/L (ref 98–111)
GFR calc Af Amer: >60 mL/min (ref >60)
GFR calc non Af Amer: >60 mL/min (ref >60)
GLUCOSE: 143 mg/dL — AB (ref 70–99)
Potassium: 3.8 mmol/L (ref 3.5–5.1)
SODIUM: 138 mmol/L (ref 135–145)
Total Bilirubin: 0.9 mg/dL (ref 0.3–1.2)
Total Protein: 6.7 g/dL (ref 6.5–8.1)

## 2018-04-15 LAB — CBC
HCT: 47 % (ref 39.0–52.0)
Hemoglobin: 15.9 g/dL (ref 13.0–17.0)
MCH: 29.7 pg (ref 26.0–34.0)
MCHC: 33.8 g/dL (ref 30.0–36.0)
MCV: 87.7 fL (ref 78.0–100.0)
PLATELETS: 284 10*3/uL (ref 150–400)
RBC: 5.36 MIL/uL (ref 4.22–5.81)
RDW: 13.5 % (ref 11.5–15.5)
WBC: 6.9 10*3/uL (ref 4.0–10.5)

## 2018-04-15 LAB — LIPASE, BLOOD: Lipase: 23 U/L (ref 11–51)

## 2018-04-15 SURGERY — LAPAROSCOPIC CHOLECYSTECTOMY WITH INTRAOPERATIVE CHOLANGIOGRAM
Anesthesia: General | Site: Abdomen

## 2018-04-15 MED ORDER — CHLORHEXIDINE GLUCONATE CLOTH 2 % EX PADS: 6.0000 | MEDICATED_PAD | Freq: Once | CUTANEOUS | Status: DC

## 2018-04-15 MED ORDER — PROPOFOL 10 MG/ML IV BOLUS
INTRAVENOUS | Status: DC | PRN
  Administered 2018-04-15: 200 mg via INTRAVENOUS

## 2018-04-15 MED ORDER — SUCCINYLCHOLINE CHLORIDE 200 MG/10ML IV SOSY
PREFILLED_SYRINGE | INTRAVENOUS | Status: AC
  Filled 2018-04-15: qty 20

## 2018-04-15 MED ORDER — CELECOXIB 200 MG PO CAPS: 200.0000 mg | ORAL_CAPSULE | ORAL | Status: DC

## 2018-04-15 MED ORDER — LACTATED RINGERS IV SOLN
INTRAVENOUS | Status: DC
  Administered 2018-04-15 (×2): via INTRAVENOUS

## 2018-04-15 MED ORDER — SODIUM CHLORIDE 0.9 % IR SOLN
Status: DC | PRN
  Administered 2018-04-15: 1000 mL

## 2018-04-15 MED ORDER — LIDOCAINE 2% (20 MG/ML) 5 ML SYRINGE
INTRAMUSCULAR | Status: AC
  Filled 2018-04-15: qty 5

## 2018-04-15 MED ORDER — HYDROCODONE-ACETAMINOPHEN 5-325 MG PO TABS: 1.0000 | ORAL_TABLET | Freq: Four times a day (QID) | ORAL | 0 refills | Status: DC | PRN

## 2018-04-15 MED ORDER — FENTANYL CITRATE (PF) 100 MCG/2ML IJ SOLN
INTRAMUSCULAR | Status: AC
  Filled 2018-04-15: qty 2

## 2018-04-15 MED ORDER — FENTANYL CITRATE (PF) 250 MCG/5ML IJ SOLN
INTRAMUSCULAR | Status: DC | PRN
  Administered 2018-04-15 (×3): 50 ug via INTRAVENOUS
  Administered 2018-04-15: 100 ug via INTRAVENOUS

## 2018-04-15 MED ORDER — ACETAMINOPHEN 500 MG PO TABS: 1000.0000 mg | ORAL_TABLET | ORAL | Status: DC

## 2018-04-15 MED ORDER — GABAPENTIN 300 MG PO CAPS
ORAL_CAPSULE | ORAL | Status: AC
  Administered 2018-04-15: 300 mg
  Filled 2018-04-15: qty 1

## 2018-04-15 MED ORDER — MIDAZOLAM HCL 2 MG/2ML IJ SOLN
INTRAMUSCULAR | Status: AC
  Filled 2018-04-15: qty 2

## 2018-04-15 MED ORDER — FENTANYL CITRATE (PF) 250 MCG/5ML IJ SOLN
INTRAMUSCULAR | Status: AC
  Filled 2018-04-15: qty 5

## 2018-04-15 MED ORDER — PROPOFOL 10 MG/ML IV BOLUS
INTRAVENOUS | Status: AC
  Filled 2018-04-15: qty 20

## 2018-04-15 MED ORDER — PHENYLEPHRINE 40 MCG/ML (10ML) SYRINGE FOR IV PUSH (FOR BLOOD PRESSURE SUPPORT)
PREFILLED_SYRINGE | INTRAVENOUS | Status: AC
  Filled 2018-04-15: qty 10

## 2018-04-15 MED ORDER — DEXAMETHASONE SODIUM PHOSPHATE 10 MG/ML IJ SOLN
INTRAMUSCULAR | Status: AC
  Filled 2018-04-15: qty 1

## 2018-04-15 MED ORDER — ONDANSETRON HCL 4 MG/2ML IJ SOLN
INTRAMUSCULAR | Status: DC | PRN
  Administered 2018-04-15: 4 mg via INTRAVENOUS

## 2018-04-15 MED ORDER — GABAPENTIN 300 MG PO CAPS: 300.0000 mg | ORAL_CAPSULE | ORAL | Status: DC

## 2018-04-15 MED ORDER — ROCURONIUM BROMIDE 10 MG/ML (PF) SYRINGE
PREFILLED_SYRINGE | INTRAVENOUS | Status: DC | PRN
  Administered 2018-04-15: 10 mg via INTRAVENOUS

## 2018-04-15 MED ORDER — SUGAMMADEX SODIUM 500 MG/5ML IV SOLN
INTRAVENOUS | Status: AC
  Filled 2018-04-15: qty 5

## 2018-04-15 MED ORDER — LIDOCAINE 2% (20 MG/ML) 5 ML SYRINGE
INTRAMUSCULAR | Status: DC | PRN
  Administered 2018-04-15: 100 mg via INTRAVENOUS

## 2018-04-15 MED ORDER — DEXAMETHASONE SODIUM PHOSPHATE 10 MG/ML IJ SOLN
INTRAMUSCULAR | Status: DC | PRN
  Administered 2018-04-15: 10 mg via INTRAVENOUS

## 2018-04-15 MED ORDER — BUPIVACAINE-EPINEPHRINE 0.5% -1:200000 IJ SOLN
INTRAMUSCULAR | Status: DC | PRN
  Administered 2018-04-15: 10 mL

## 2018-04-15 MED ORDER — SUGAMMADEX SODIUM 200 MG/2ML IV SOLN
INTRAVENOUS | Status: DC | PRN
  Administered 2018-04-15: 500 mg via INTRAVENOUS

## 2018-04-15 MED ORDER — OXYCODONE HCL 5 MG PO TABS
ORAL_TABLET | ORAL | Status: AC
  Filled 2018-04-15: qty 2

## 2018-04-15 MED ORDER — ONDANSETRON HCL 4 MG/2ML IJ SOLN
INTRAMUSCULAR | Status: AC
  Filled 2018-04-15: qty 2

## 2018-04-15 MED ORDER — CELECOXIB 200 MG PO CAPS
ORAL_CAPSULE | ORAL | Status: AC
  Administered 2018-04-15: 200 mg
  Filled 2018-04-15: qty 1

## 2018-04-15 MED ORDER — ROCURONIUM BROMIDE 50 MG/5ML IV SOSY
PREFILLED_SYRINGE | INTRAVENOUS | Status: AC
  Filled 2018-04-15: qty 5

## 2018-04-15 MED ORDER — OXYCODONE HCL 5 MG PO TABS
5.0000 mg | ORAL_TABLET | ORAL | Status: DC | PRN
  Administered 2018-04-15: 10 mg via ORAL

## 2018-04-15 MED ORDER — 0.9 % SODIUM CHLORIDE (POUR BTL) OPTIME
TOPICAL | Status: DC | PRN
  Administered 2018-04-15: 1000 mL

## 2018-04-15 MED ORDER — FENTANYL CITRATE (PF) 100 MCG/2ML IJ SOLN
25.0000 ug | INTRAMUSCULAR | Status: DC | PRN
  Administered 2018-04-15: 25 ug via INTRAVENOUS

## 2018-04-15 MED ORDER — CEFAZOLIN SODIUM-DEXTROSE 2-4 GM/100ML-% IV SOLN
INTRAVENOUS | Status: AC
  Filled 2018-04-15: qty 100

## 2018-04-15 MED ORDER — MIDAZOLAM HCL 5 MG/5ML IJ SOLN
INTRAMUSCULAR | Status: DC | PRN
  Administered 2018-04-15: 2 mg via INTRAVENOUS

## 2018-04-15 MED ORDER — ACETAMINOPHEN 500 MG PO TABS
ORAL_TABLET | ORAL | Status: AC
  Administered 2018-04-15: 1000 mg
  Filled 2018-04-15: qty 2

## 2018-04-15 MED ORDER — SODIUM CHLORIDE 0.9 % IV SOLN
INTRAVENOUS | Status: DC | PRN
  Administered 2018-04-15: 100 mL

## 2018-04-15 MED ORDER — SUCCINYLCHOLINE CHLORIDE 200 MG/10ML IV SOSY
PREFILLED_SYRINGE | INTRAVENOUS | Status: DC | PRN
  Administered 2018-04-15: 200 mg via INTRAVENOUS
  Administered 2018-04-15: 50 mg via INTRAVENOUS

## 2018-04-15 SURGICAL SUPPLY — 41 items
APPLIER CLIP ROT 10 11.4 M/L (STAPLE) ×3
BLADE CLIPPER SURG (BLADE) IMPLANT
CANISTER SUCT 3000ML PPV (MISCELLANEOUS) ×3 IMPLANT
CHLORAPREP W/TINT 26ML (MISCELLANEOUS) ×3 IMPLANT
CLIP APPLIE ROT 10 11.4 M/L (STAPLE) ×1 IMPLANT
COVER MAYO STAND STRL (DRAPES) ×3 IMPLANT
COVER SURGICAL LIGHT HANDLE (MISCELLANEOUS) ×3 IMPLANT
DERMABOND ADVANCED (GAUZE/BANDAGES/DRESSINGS) ×4
DERMABOND ADVANCED .7 DNX12 (GAUZE/BANDAGES/DRESSINGS) ×2 IMPLANT
DRAPE C-ARM 42X72 X-RAY (DRAPES) ×3 IMPLANT
ELECT REM PT RETURN 9FT ADLT (ELECTROSURGICAL) ×3
ELECTRODE REM PT RTRN 9FT ADLT (ELECTROSURGICAL) ×1 IMPLANT
GLOVE ECLIPSE 8.0 STRL XLNG CF (GLOVE) ×3 IMPLANT
GLOVE EUDERMIC 7 POWDERFREE (GLOVE) ×3 IMPLANT
GOWN STRL REUS W/ TWL LRG LVL3 (GOWN DISPOSABLE) ×3 IMPLANT
GOWN STRL REUS W/ TWL XL LVL3 (GOWN DISPOSABLE) ×2 IMPLANT
GOWN STRL REUS W/TWL LRG LVL3 (GOWN DISPOSABLE) ×6
GOWN STRL REUS W/TWL XL LVL3 (GOWN DISPOSABLE) ×4
KIT BASIN OR (CUSTOM PROCEDURE TRAY) ×3 IMPLANT
KIT TURNOVER KIT B (KITS) ×3 IMPLANT
NEEDLE INSUFFLATION 14GA 120MM (NEEDLE) ×3 IMPLANT
NS IRRIG 1000ML POUR BTL (IV SOLUTION) ×3 IMPLANT
PAD ARMBOARD 7.5X6 YLW CONV (MISCELLANEOUS) ×3 IMPLANT
POUCH SPECIMEN RETRIEVAL 10MM (ENDOMECHANICALS) ×3 IMPLANT
SCISSORS LAP 5X35 DISP (ENDOMECHANICALS) ×3 IMPLANT
SET CHOLANGIOGRAPH 5 50 .035 (SET/KITS/TRAYS/PACK) ×3 IMPLANT
SET IRRIG TUBING LAPAROSCOPIC (IRRIGATION / IRRIGATOR) ×3 IMPLANT
SLEEVE ENDOPATH XCEL 5M (ENDOMECHANICALS) ×6 IMPLANT
SPECIMEN JAR SMALL (MISCELLANEOUS) ×3 IMPLANT
SUT MNCRL AB 4-0 PS2 18 (SUTURE) ×6 IMPLANT
SUT VIC AB 3-0 SH 27 (SUTURE) ×2
SUT VIC AB 3-0 SH 27X BRD (SUTURE) ×1 IMPLANT
SUT VICRYL 0 UR6 27IN ABS (SUTURE) ×3 IMPLANT
TOWEL OR 17X24 6PK STRL BLUE (TOWEL DISPOSABLE) ×3 IMPLANT
TOWEL OR 17X26 10 PK STRL BLUE (TOWEL DISPOSABLE) ×3 IMPLANT
TRAY LAPAROSCOPIC MC (CUSTOM PROCEDURE TRAY) ×3 IMPLANT
TROCAR XCEL BLUNT TIP 100MML (ENDOMECHANICALS) ×3 IMPLANT
TROCAR XCEL NON-BLD 11X100MML (ENDOMECHANICALS) ×6 IMPLANT
TROCAR XCEL NON-BLD 5MMX100MML (ENDOMECHANICALS) ×3 IMPLANT
TUBING INSUFFLATION (TUBING) ×3 IMPLANT
WATER STERILE IRR 1000ML POUR (IV SOLUTION) ×3 IMPLANT

## 2018-04-15 NOTE — Transfer of Care (Signed)
Immediate Anesthesia Transfer of Care Note  Patient: Cody KennedyBrian Weppler  Procedure(s) Performed: LAPAROSCOPIC CHOLECYSTECTOMY WITH INTRAOPERATIVE CHOLANGIOGRAM ERAS PATHWAY (N/A Abdomen)  Patient Location: PACU  Anesthesia Type:General  Level of Consciousness: awake and patient cooperative  Airway & Oxygen Therapy: Patient Spontanous Breathing and Patient connected to face mask oxygen  Post-op Assessment: Report given to RN and Post -op Vital signs reviewed and stable  Post vital signs: Reviewed and stable  Last Vitals:  Vitals Value Taken Time  BP 150/99 04/15/2018  3:50 PM  Temp    Pulse 96 04/15/2018  3:53 PM  Resp 17 04/15/2018  3:53 PM  SpO2 100 % 04/15/2018  3:53 PM  Vitals shown include unvalidated device data.  Last Pain:  Vitals:   04/15/18 1225  TempSrc:   PainSc: 3       Patients Stated Pain Goal: 7 (04/15/18 1225)  Complications: No apparent anesthesia complications

## 2018-04-15 NOTE — Anesthesia Procedure Notes (Signed)
Procedure Name: Intubation Date/Time: 04/15/2018 2:00 PM Performed by: Hart RobinsonsByrd, Lavin Petteway R, CRNA Pre-anesthesia Checklist: Patient identified, Emergency Drugs available, Suction available and Patient being monitored Patient Re-evaluated:Patient Re-evaluated prior to induction Oxygen Delivery Method: Circle System Utilized Preoxygenation: Pre-oxygenation with 100% oxygen Induction Type: IV induction Ventilation: Two handed mask ventilation required and Oral airway inserted - appropriate to patient size Laryngoscope Size: Glidescope and 4 Grade View: Grade I Tube type: Oral Tube size: 7.5 mm Number of attempts: 1 Airway Equipment and Method: Stylet and Oral airway Placement Confirmation: ETT inserted through vocal cords under direct vision,  positive ETCO2 and breath sounds checked- equal and bilateral Secured at: 23 cm Tube secured with: Tape Dental Injury: Teeth and Oropharynx as per pre-operative assessment  Comments: No change in dentition from pre-procedure

## 2018-04-15 NOTE — Anesthesia Preprocedure Evaluation (Addendum)
Anesthesia Evaluation  Patient identified by MRN, date of birth, ID band Patient awake    Reviewed: Allergy & Precautions, NPO status , Patient's Chart, lab work & pertinent test results  Airway Mallampati: I  TM Distance: >3 FB Neck ROM: Full    Dental no notable dental hx. (+) Dental Advisory Given, Missing,    Pulmonary neg pulmonary ROS,    Pulmonary exam normal breath sounds clear to auscultation       Cardiovascular hypertension, Normal cardiovascular exam Rhythm:Regular Rate:Normal     Neuro/Psych  Headaches, Anxiety    GI/Hepatic Neg liver ROS, GERD  Controlled,  Endo/Other  negative endocrine ROS  Renal/GU negative Renal ROS  negative genitourinary   Musculoskeletal negative musculoskeletal ROS (+)   Abdominal   Peds  Hematology negative hematology ROS (+)   Anesthesia Other Findings obese gallstones  Reproductive/Obstetrics                           Anesthesia Physical Anesthesia Plan  ASA: III  Anesthesia Plan: General   Post-op Pain Management:    Induction: Intravenous  PONV Risk Score and Plan: 4 or greater and Dexamethasone, Ondansetron, Midazolam, Scopolamine patch - Pre-op and Diphenhydramine  Airway Management Planned: Oral ETT  Additional Equipment:   Intra-op Plan:   Post-operative Plan: Extubation in OR  Informed Consent: I have reviewed the patients History and Physical, chart, labs and discussed the procedure including the risks, benefits and alternatives for the proposed anesthesia with the patient or authorized representative who has indicated his/her understanding and acceptance.   Dental advisory given  Plan Discussed with: CRNA  Anesthesia Plan Comments:        Anesthesia Quick Evaluation

## 2018-04-15 NOTE — Interval H&P Note (Signed)
History and Physical Interval Note:  04/15/2018 1:09 PM  Cody KennedyBrian Garling  has presented today for surgery, with the diagnosis of gallstones  The various methods of treatment have been discussed with the patient and family. After consideration of risks, benefits and other options for treatment, the patient has consented to  Procedure(s): LAPAROSCOPIC CHOLECYSTECTOMY WITH INTRAOPERATIVE CHOLANGIOGRAM ERAS PATHWAY (N/A) as a surgical intervention .  The patient's history has been reviewed, patient examined, no change in status, stable for surgery.  I have reviewed the patient's chart and labs.  Questions were answered to the patient's satisfaction.     Ernestene MentionHaywood M Teagon Kron

## 2018-04-15 NOTE — Discharge Instructions (Signed)
[facility] ______[facility] °LAPAROSCOPIC SURGERY: POST OP INSTRUCTIONS °Always review your discharge instruction sheet given to you by the facility where your surgery was performed. °IF YOU HAVE DISABILITY OR FAMILY LEAVE FORMS, YOU MUST BRING THEM TO THE OFFICE FOR PROCESSING.   °DO NOT GIVE THEM TO YOUR DOCTOR. ° °1. A prescription for pain medication may be given to you upon discharge.  Take your pain medication as prescribed, if needed.  If narcotic pain medicine is not needed, then you may take acetaminophen (Tylenol) or ibuprofen (Advil) as needed. °2. Take your usually prescribed medications unless otherwise directed. °3. If you need a refill on your pain medication, please contact your pharmacy.  They will contact our office to request authorization. Prescriptions will not be filled after 5pm or on week-ends. °4. You should follow a light diet the first few days after arrival home, such as soup and crackers, etc.  Be sure to include lots of fluids daily. °5. Most patients will experience some swelling and bruising in the area of the incisions.  Ice packs will help.  Swelling and bruising can take several days to resolve.  °6. It is common to experience some constipation if taking pain medication after surgery.  Increasing fluid intake and taking a stool softener (such as Colace) will usually help or prevent this problem from occurring.  A mild laxative (Milk of Magnesia or Miralax) should be taken according to package instructions if there are no bowel movements after 48 hours. °7. Unless discharge instructions indicate otherwise, you may remove your bandages 24-48 hours after surgery, and you may shower at that time.  You may have steri-strips (small skin tapes) in place directly over the incision.  These strips should be left on the skin for 7-10 days.  If your surgeon used skin glue on the incision, you may shower in 24 hours.  The glue will flake off over the next 2-3 weeks.  Any sutures or  staples will be removed at the office during your follow-up visit. °8. ACTIVITIES:  You may resume regular (light) daily activities beginning the next day--such as daily self-care, walking, climbing stairs--gradually increasing activities as tolerated.  You may have sexual intercourse when it is comfortable.  Refrain from any heavy lifting or straining until approved by your doctor. °a. You may drive when you are no longer taking prescription pain medication, you can comfortably wear a seatbelt, and you can safely maneuver your car and apply brakes. °b. RETURN TO WORK:  __________________________________________________________ °9. You should see your doctor in the office for a follow-up appointment approximately 2-3 weeks after your surgery.  Make sure that you call for this appointment within a day or two after you arrive home to insure a convenient appointment time. °10. OTHER INSTRUCTIONS: __________________________________________________________________________________________________________________________ __________________________________________________________________________________________________________________________ °WHEN TO CALL YOUR DOCTOR: °1. Fever over 101.0 °2. Inability to urinate °3. Continued bleeding from incision. °4. Increased pain, redness, or drainage from the incision. °5. Increasing abdominal pain ° °The clinic staff is available to answer your questions during regular business hours.  Please don’t hesitate to call and ask to speak to one of the nurses for clinical concerns.  If you have a medical emergency, go to the nearest emergency room or call 911.  A surgeon from [facility] is always on call at the hospital. °[address], [address], [place], [place]  [address] ? [address] [address], [place], [place]   [address] °[phone] ? [phone] ? FAX [phone] °Web site:   [email or web address] °

## 2018-04-15 NOTE — Op Note (Signed)
Patient Name:           Cody KennedyBrian Stevenson   Date of Surgery:        04/15/2018  Pre op Diagnosis:      Chronic cholecystitis with cholelithiasis  Post op Diagnosis:    Same  Procedure:                 Laparoscopic cholecystectomy with cholangiogram  Surgeon:                     Cody Stevenson, M.D., FACS  Assistant:                      Cody CanalesSteve gross, MD   Indication for Assistant: Morbid obesity.  Previous surgery.  Difficult exposure.  Assistant indicated to expedite case and lower risk of complications  Operative Indications:   This is a 37 y.o man referred by [facility] Pricilla LovelessScott Stevenson [facility] for management of gallstones and elevated LFT's. His PCP is Cody Stevenson, [place].      Over the past year he has had some episodes of upped abdominal pain, nausea and vomiting. No serious. Noted onset severe upper abdominal pain, nausea and vomiting on 03/28/2018 at work after breakfast. This was sudden onset and resolved 8-10 hours later while in ED.Denies food intolerances, diarhea, fever, chills, dysurea, jaundice, liver disease. Labs 9/3 showed AST 286, Alt 252, T.Bili 1.9. Other labs normal.CT suggested fatty liver and a gas containing gallstone. US showed a 1 cm. gallstone, CBD 4 mm, no inflammation.  Theoretically he may have passed a common bile duct stone.      PH-pyloromyotomy age 38 months. [place]. HTN on no meds. BMI 46.R. eye lens surgery 1990. FH- GM had GB out. M deceased - MI. Father deceased age 38. SH-single no children. 3 roommates. works at [employer]. no tobacco. occas. ETOH.      I advised lap chole with IOC sooner rather than later. He agrees. I discussed indications, details, technique and risks in detail. He is aware of risk of bleeding, infection, conversion to open laparotomy, bile leak injury to adjacen organs, diarrhea, port site hernia, and other unforseen complications. Understands will need further intervention if CBD stones. He knows morbid obesity  and prior surgery complicates this. He understands. All questiions answered. Her agrees with plan  Operative Findings:       The liver was extremely large and heavy but had normal textture and did not appear cirrhotic or otherwise pathologically diseased.  The gallbladder was thin-walled, discolored, chronically inflamed.  The intraoperative cholangiogram was normal showing normal intrahepatic and extrahepatic biliary anatomy, no dilatation, no filling leading defect, and no obstruction with good flow of contrast into the duodenum.  No other abnormalities of the viscera or peritoneum were observed.  Procedure in Detail:          Following the induction of general endotracheal anesthesia the patient's abdomen was prepped and draped in a sterile fashion.  Intravenous antibiotics were given.  Surgical timeout was performed.  0.25% Marcaine with epinephrine was used as a local infiltration anesthetic.      A small puncture wound was made in the left subcostal region.  Veress needle was inserted and tested.  Pneumoperitoneum was created.  Veress needle was removed and a 5 mm optical trocar was inserted into the peritoneal cavity without difficulty.  Video camera was inserted.  4 quadrant inspection looked good.  There was no signs of injury or bleeding.  11 mm trocar was placed in the midline about 10 cm above the umbilicus.  11 mm trocar was placed in the subxiphoid region.  Two 5 mm trochars were placed in the right subcostal area.  Pneumoperitoneum was maintained without difficulty.      The gallbladder fundus was identified and elevated.  Chronic adhesions to the body and infundibulum were slowly taken down with scissor.  The duodenum was adherent to the gallbladder but the adhesions were soft and filmy and this dissection was uneventful.  The infundibulum was elevated.  We incised the peritoneum over the neck of the gallbladder.  I found a small anterior branch and a small posterior branch of the cystic  artery.  These were secured with metal clips and divided.  I created a very large window and critical view.  Cholangiogram catheter was inserted and a cholangiogram obtained with the C arm.  The cholangiogram was normal as described above.  The cholangiogram catheter was removed.  The cystic duct was secured with multiple metal clips and divided.  Gallbladder was dissected from its bed with electrocautery and placed in a specimen bag.  Small bleeding points in the gallbladder bed were controlled with cautery with good hemostasis.      I had to enlarge the fascial incision in the midline trocar above the umbilicus.  This allowed [place] to remove the gallbladder in a specimen bag and it intact.  The operative field was irrigated.  Irrigation fluid became completely clear.  There was no bleeding or bile leak.  The pneumoperitoneum was released and all of the trochars were removed.  The fascia in the midline above the umbilicus was closed with several interrupted sutures of 0 Vicryl.  The wounds were irrigated.  All the incisions were closed with 4-0 Monocryl subcuticular sutures.  Also placed a few Vicryl sutures and the larger supraumbilical incision to reapproximate the subcutaneous tissue.  Dermabond was placed.  Patient tolerated the procedure well and was taken to PACU in stable condition.  EBL 25 cc or less.  Counts correct.  Complications none.    Addendum: I logged onto the [employer] and reviewed his prescription medication history    Cody Stevenson M. Derrell Lolling, M.D., FACS General and Minimally Invasive Surgery Breast and Colorectal Surgery  04/15/2018 3:44 PM

## 2018-04-18 ENCOUNTER — Encounter (HOSPITAL_COMMUNITY): Payer: Self-pay | Admitting: General Surgery

## 2018-04-18 NOTE — Anesthesia Postprocedure Evaluation (Signed)
Anesthesia Post Note  Patient: Bella KennedyBrian Schwegler  Procedure(s) Performed: LAPAROSCOPIC CHOLECYSTECTOMY WITH INTRAOPERATIVE CHOLANGIOGRAM ERAS PATHWAY (N/A Abdomen)     Patient location during evaluation: PACU Anesthesia Type: General Level of consciousness: awake and alert Pain management: pain level controlled Vital Signs Assessment: post-procedure vital signs reviewed and stable Respiratory status: spontaneous breathing, nonlabored ventilation, respiratory function stable and patient connected to nasal cannula oxygen Cardiovascular status: blood pressure returned to baseline and stable Postop Assessment: no apparent nausea or vomiting Anesthetic complications: no    Last Vitals:  Vitals:   04/15/18 1705 04/15/18 1720  BP: (!) 144/77 134/68  Pulse: 86 91  Resp: 17 19  Temp:  (!) 36.4 C  SpO2: 91% 93%    Last Pain:  Vitals:   04/15/18 1720  TempSrc:   PainSc: 0-No pain   Pain Goal: Patients Stated Pain Goal: 7 (04/15/18 1225)               Lucretia Kernarolyn E Witman

## 2018-05-06 ENCOUNTER — Ambulatory Visit: Payer: Managed Care, Other (non HMO) | Admitting: Physician Assistant

## 2018-05-11 ENCOUNTER — Ambulatory Visit (INDEPENDENT_AMBULATORY_CARE_PROVIDER_SITE_OTHER): Payer: Managed Care, Other (non HMO) | Admitting: Gastroenterology

## 2018-05-11 ENCOUNTER — Encounter

## 2018-05-11 ENCOUNTER — Encounter: Payer: Self-pay | Admitting: Gastroenterology

## 2018-05-11 VITALS — BP 128/91 | HR 71 | Ht 70.0 in | Wt 323.0 lb

## 2018-05-11 DIAGNOSIS — R198 Other specified symptoms and signs involving the digestive system and abdomen: Secondary | ICD-10-CM | POA: Diagnosis not present

## 2018-05-11 DIAGNOSIS — K625 Hemorrhage of anus and rectum: Secondary | ICD-10-CM | POA: Diagnosis not present

## 2018-05-11 DIAGNOSIS — K6289 Other specified diseases of anus and rectum: Secondary | ICD-10-CM

## 2018-05-11 NOTE — Patient Instructions (Signed)
If you are age 38 or older, your body mass index should be between 23-30. Your Body mass index is 46.35 kg/m. If this is out of the aforementioned range listed, please consider follow up with your Primary Care Provider.  If you are age 32 or younger, your body mass index should be between 19-25. Your Body mass index is 46.35 kg/m. If this is out of the aformentioned range listed, please consider follow up with your Primary Care Provider.   Apply Recticare after bowel movements.  If not better in a few weeks please call. [phone]

## 2018-05-11 NOTE — Progress Notes (Signed)
[facility] Consult Note:  History: Cody Stevenson 05/11/2018  Referring physician: Morrell Riddle, PA-C  Reason for consult/chief complaint: Rectal Pain; Rectal Bleeding (light red on tissue after multiple bowel movements.); and Diarrhea (more frequent since gallbladder removal )   Subjective  HPI:  This is a pleasant 37 year old man referred by primary care for rectal pain and bleeding.  Sometime in June he developed a perianal sensation of discomfort with occasional blood on the paper.  This would seem to occur with bowel movements and be worse after cleaning.  He saw primary care, and they felt it was probably a hemorrhoid.  He got some Anusol HC cream that did not help.  The symptoms seem to occur more frequently in the last month because he had a cholecystectomy, and now having more frequent bowel movements as a result.  He does not feel as if it is a tearing sensation anal area with bowel movements it is just a dull discomfort, and he feels like there is a "ball down there".  ROS:  Review of Systems He denies chest pain dyspnea or dysuria Is a do for back pain  Past Medical History: Past Medical History:  Diagnosis Date  . Anxiety   . Cholelithiasis with chronic cholecystitis 04/15/2018  . Gallstones   . GERD (gastroesophageal reflux disease)   . Headache   . Hypertension   . Obesity      Past Surgical History: Past Surgical History:  Procedure Laterality Date  . APPENDECTOMY    . CHOLECYSTECTOMY N/A 04/15/2018   Procedure: LAPAROSCOPIC CHOLECYSTECTOMY WITH INTRAOPERATIVE CHOLANGIOGRAM ERAS PATHWAY;  Surgeon: Claud Kelp, MD;  Location[facility] OR;  Service: General;  Laterality: N/A;  . EYE SURGERY    . HERNIA REPAIR       Family History: Family History  Problem Relation Age of Onset  . Heart attack Mother        died 80  . Obesity Brother   . Heart disease Maternal Grandmother   . Heart attack Maternal Grandmother     Social  History: Social History   Socioeconomic History  . Marital status: Divorced    Spouse name: Not on file  . Number of children: Not on file  . Years of education: Not on file  . Highest education level: Not on file  Occupational History  . Occupation: [employer]   Social Needs  . Financial resource strain: Not on file  . Food insecurity:    Worry: Not on file    Inability: Not on file  . Transportation needs:    Medical: Not on file    Non-medical: Not on file  Tobacco Use  . Smoking status: Never Smoker  . Smokeless tobacco: Never Used  Substance and Sexual Activity  . Alcohol use: Yes    Alcohol/week: 1.0 standard drinks    Types: 1 Standard drinks or equivalent per week    Comment: social monthly  . Drug use: No  . Sexual activity: Not Currently    Birth control/protection: None  Lifestyle  . Physical activity:    Days per week: Not on file    Minutes per session: Not on file  . Stress: Not on file  Relationships  . Social connections:    Talks on phone: Not on file    Gets together: Not on file    Attends religious service: Not on file    Active member of club or organization: Not on file    Attends meetings of  clubs or organizations: Not on file    Relationship status: Not on file  Other Topics Concern  . 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   No children    Allergies: No Known Allergies  Outpatient Meds: No current outpatient medications on file.   No current facility-administered medications for this visit.       ___________________________________________________________________ Objective   Exam:  BP (!) 128/91   Pulse 71   Ht 5\' 10"  (1.778 m)   Wt (!) 323 lb (146.5 kg)   BMI 46.35 kg/m    General: this is a(n) well-appearing man  Eyes: sclera anicteric, no redness  ENT: oral mucosa moist without lesions, no cervical or supraclavicular lymphadenopathy, good  dentition  CV: RRR without murmur, S1/S2, no JVD, no peripheral edema  Resp: clear to auscultation bilaterally, normal RR and effort noted  GI: soft, no tenderness, with active bowel sounds.  Cannot assess hepatosplenomegaly due to body habitus.  Skin; warm and dry, no rash or jaundice noted  Rectal (chaperoned by MA Elon Spanner): He has what appears to be a redundant distal anal skin fold and a hypertrophied anal papilla.  Digital exam revealed no tenderness or fissure or palpable abnormality.  Normal internal exam and distal rectum.  Anoscopy revealed no anal abnormalities.   Assessment: Encounter Diagnoses  Name Primary?  Marland Kitchen Anal pain Yes  . Anal bleeding   . Change in bowel function     He has a hypertrophied anal papilla, not an external hemorrhoid.  I believe this accounts for what he is feeling in that area.  There is no fissure or mass seen or felt.  He has some perianal skin irritation, and I think this is why he gets some bleeding after wiping multiple times.  Plan:  I gave him some advice on cleaning regimen with toilet wipes rather than paper, and gave him samples of Recticare ointment.  He can use some Desitin or Balmex barrier protection.  I think there may be some hygiene issue keeping that area dry because of his body habitus. He does not appear to need an endoscopic work-up.  If he continues to have same symptoms despite the above treatment, then he should be referred to colorectal surgery for further evaluation.  Thank you for the courtesy of this consult.  Please call me with any questions or concerns.  [name]  CC: Cody Riddle, PA-C

## 2018-05-19 ENCOUNTER — Encounter: Payer: Self-pay | Admitting: Skilled Nursing Facility1

## 2018-05-19 ENCOUNTER — Encounter: Payer: Managed Care, Other (non HMO) | Attending: "Endocrinology | Admitting: Skilled Nursing Facility1

## 2018-05-19 DIAGNOSIS — Z6841 Body Mass Index (BMI) 40.0 and over, adult: Secondary | ICD-10-CM | POA: Insufficient documentation

## 2018-05-19 DIAGNOSIS — Z713 Dietary counseling and surveillance: Secondary | ICD-10-CM | POA: Insufficient documentation

## 2018-05-19 DIAGNOSIS — K760 Fatty (change of) liver, not elsewhere classified: Secondary | ICD-10-CM | POA: Diagnosis not present

## 2018-05-19 DIAGNOSIS — R030 Elevated blood-pressure reading, without diagnosis of hypertension: Secondary | ICD-10-CM | POA: Insufficient documentation

## 2018-05-19 DIAGNOSIS — M5442 Lumbago with sciatica, left side: Secondary | ICD-10-CM | POA: Insufficient documentation

## 2018-05-19 DIAGNOSIS — G8929 Other chronic pain: Secondary | ICD-10-CM | POA: Insufficient documentation

## 2018-05-19 NOTE — Progress Notes (Signed)
  Assessment:  Primary concerns today: steatohepatitis.   Pt states he is off Wednsdays and thursdays. Pt states since high school he has been struggling with weight. Pt states he has tried different diets. Pt states he lost 100 pounds following a generally healthy diet but then moved states and has stopped that behavior. Pt states some days a he cannot sleep. Pt states he works until [employer]. Pt states 6 months ago he was diagnosed with a spinal condition which causes pain and flarups up causing him to lose sleep. Pt states since the cholesystectomy he has had diarrhea. Pt states he is a stress/bored eater. Pt states in the past when he recognized this behavior he cleaned or worked out. Pt states most of his family is in [place] but now it is just his brother stating he no longer has a lot of support. Pt state his current health problems and low energy are motivating him to make changes. Pt states he lives with his brother and sister in law. Pt states he snacks at work because he is stressed. Pt states she has been working on eating until satisfaction instead of fullness.  Pt states he needs a coach to plan out what he needs like in football and he will follow that.   MEDICATIONS: See List    DIETARY INTAKE:  Usual eating pattern includes 2 meals and 3 snacks per day.  Everyday foods include none stated.  Avoided foods include stated    24-hr recall: skipping breakfast most days; meals outside the home 2 a week B ( 10 AM): eggs and 2 slices of bread with cheese or that plus grits and sausage or pancakes  Snk ( AM): nuts (a full sandwich baggie) L ( PM): frozen pizza or frozen meal or sandwich with chips or hamburger helper Snk ( PM): peanut butter crackers and fruit and granola bar D ( PM):  Chicken with mac n cheese with green beans or spaggetti or soup or chili Snk ( PM): sometimes salsa and chips or ice cream Beverages: diet pepsi, 64 oz of water, (regular soda, Gatorade,  juice: once a  week)  Usual physical activity: ADL's  Estimated energy needs: 1800 calories 200 g carbohydrates 135 g protein 50 g fat  Progress Towards Goal(s):  In progress.    Intervention:  Nutrition counseling. Dietitian educated the pt on a diet targeting at losing fat from his liver. Goals: -Explore what makes you happy and how to get that  -Aim to have non-starchy veggies with 2 meals 7 days a week -work on identifying how you are feeling or what need you have in the moment rather then eating  -Walking 2 days a week while listening to music  Teaching Method Utilized:  Visual Auditory Hands on  Handouts given during visit include:  Meals ideas  Should I eat  Needs/emotions   Barriers to learning/adherence to lifestyle change: emotional eating  Demonstrated degree of understanding via:  Teach Back   Monitoring/Evaluation:  Dietary intake, exercise, and body weight prn.

## 2018-05-19 NOTE — Patient Instructions (Addendum)
-  Explore what makes you happy and how to get that   -Aim to have non-starchy veggies with 2 meals 7 days a week  -work on identifying how you are feeling or what need you have in the moment rather then eating   -Walking 2 days a week while listening to music

## 2018-06-29 ENCOUNTER — Ambulatory Visit: Payer: Managed Care, Other (non HMO) | Admitting: Skilled Nursing Facility1

## 2018-12-13 IMAGING — CT CT ABD-PELV W/ CM
2 of 4 series · 16 of 46 positions shown, 18 images · IV contrast (APPLIED)
Comparison: Chest x-ray 03/28/2018

CLINICAL DATA: Right upper quadrant pain

EXAM:
CT ABDOMEN AND PELVIS WITH CONTRAST
TECHNIQUE: Multidetector CT imaging of the abdomen and pelvis was performed
using the standard protocol following bolus administration of
intravenous contrast.
CONTRAST:  100mL [id number] IOPAMIDOL ([id number]) INJECTION 61%

[Series 3: abdomen 5.0 · axial · 0.98mm/px · z∈[-480,-25]mm · 13 of 106 slices shown, 15 images]
[im 8/106  soft-tissue]
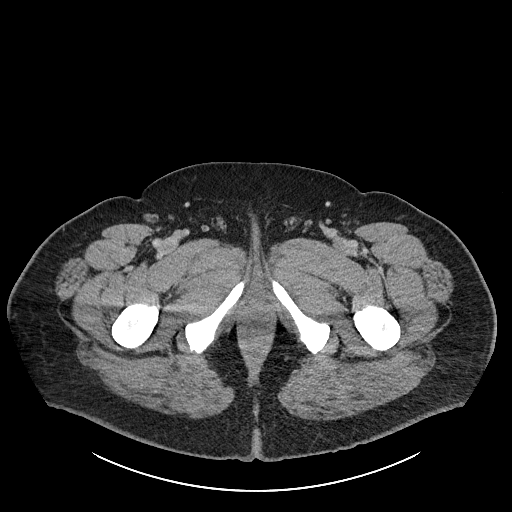
[im 8/106  bone]
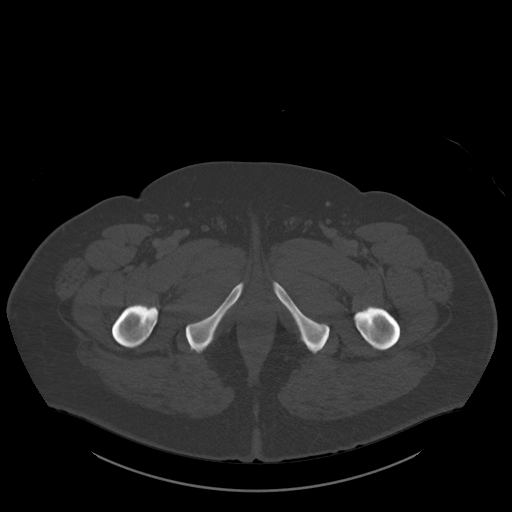
[im 15/106  soft-tissue]
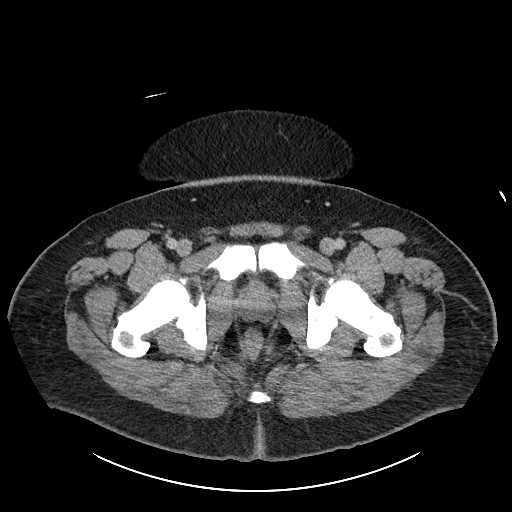
[im 22/106  soft-tissue]
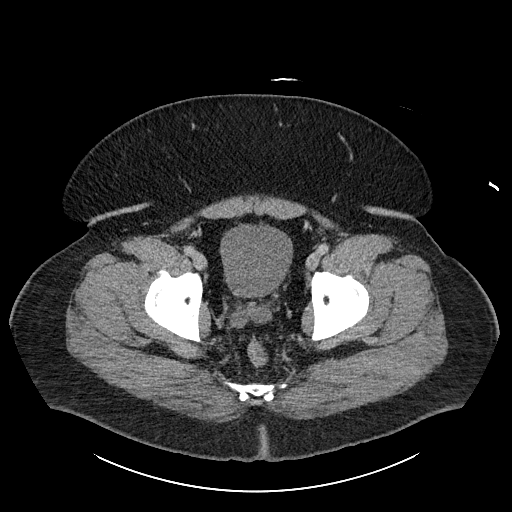
[im 29/106  soft-tissue]
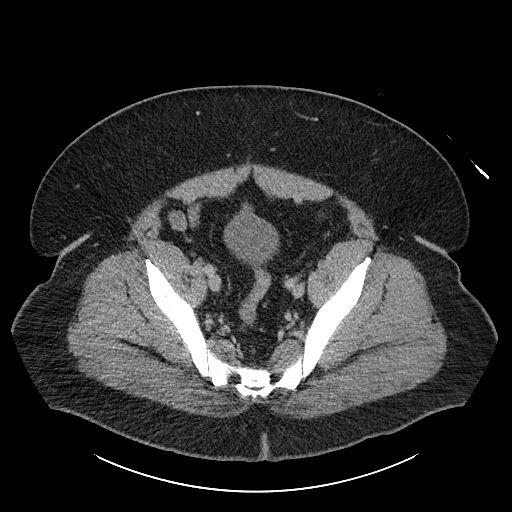
[im 36/106  soft-tissue]
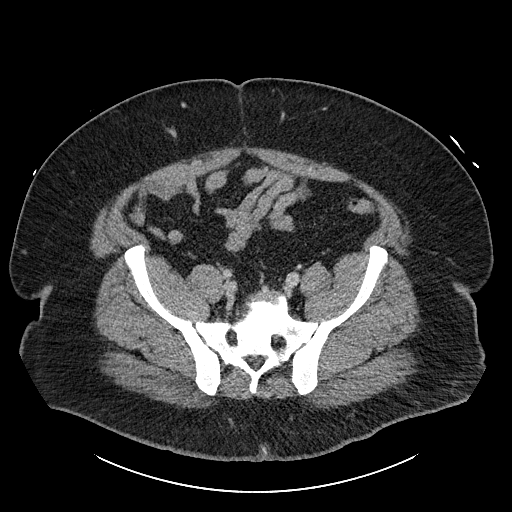
[im 43/106  soft-tissue]
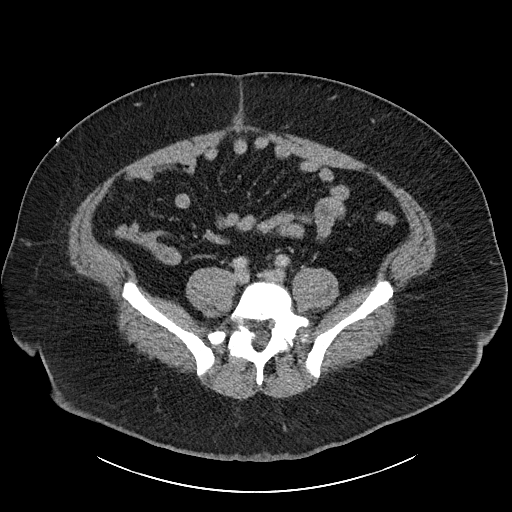
[im 57/106  soft-tissue]
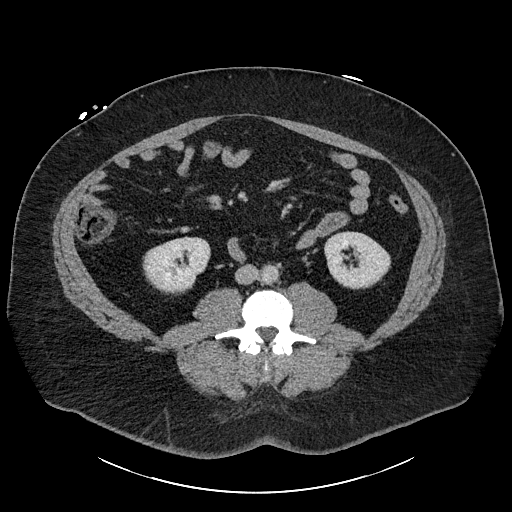
[im 64/106  soft-tissue]
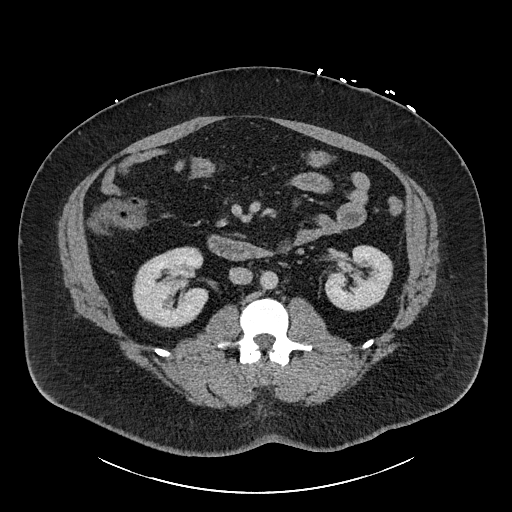
[im 71/106  soft-tissue]
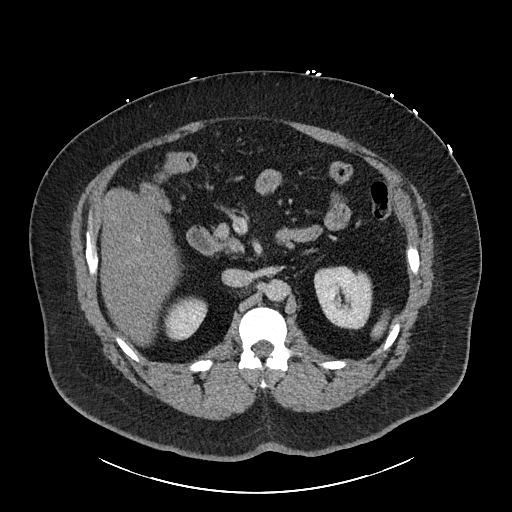
[im 71/106  bone]
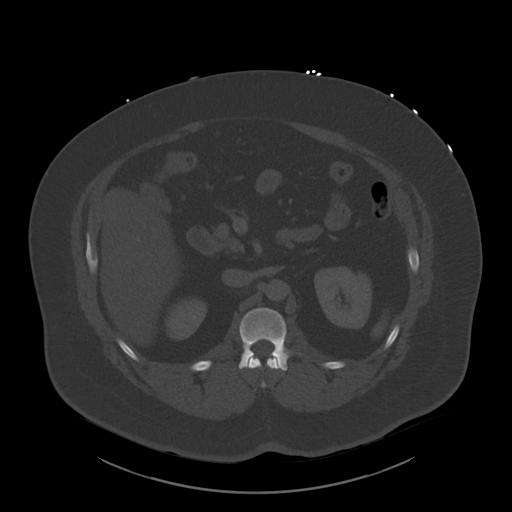
[im 78/106  soft-tissue]
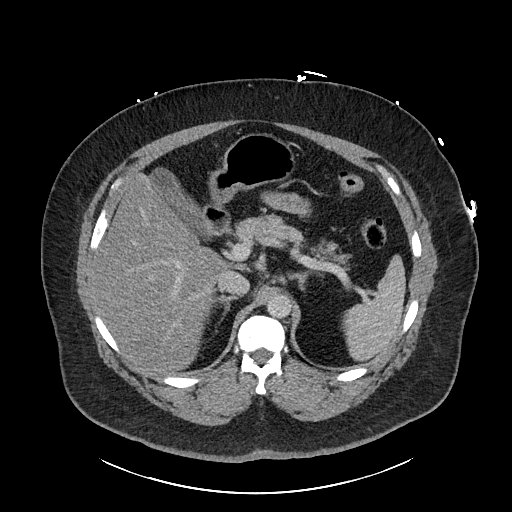
[im 85/106  soft-tissue]
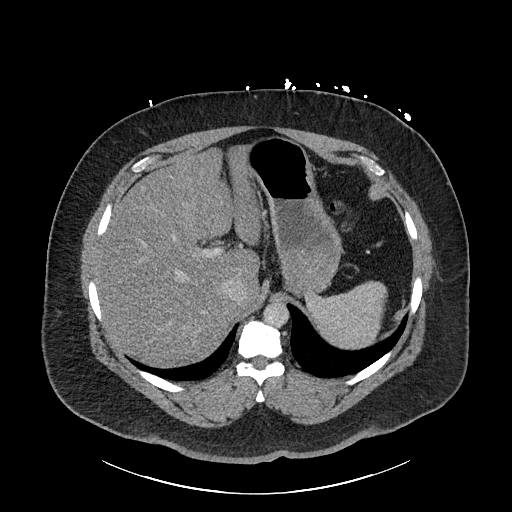
[im 92/106  soft-tissue]
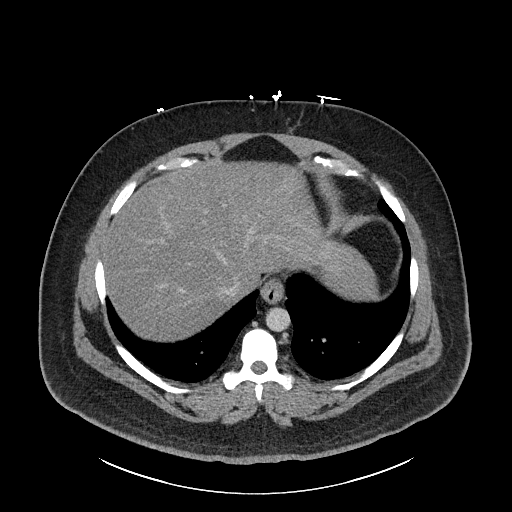
[im 99/106  soft-tissue]
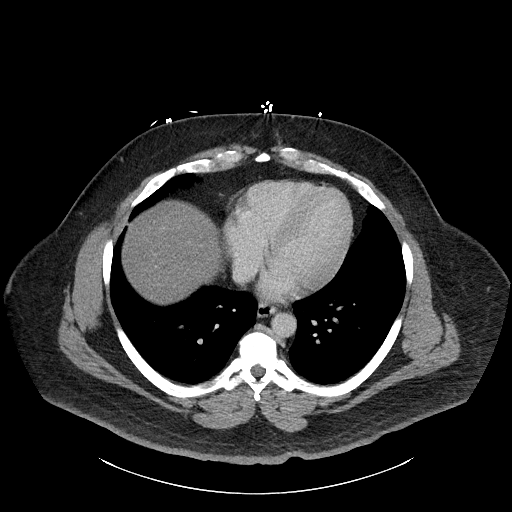

[Series 6: abdomen 3.0 mpr cor · coronal · 0.79mm/px · 3 of 114 slices shown]
[im 38/114  soft-tissue]
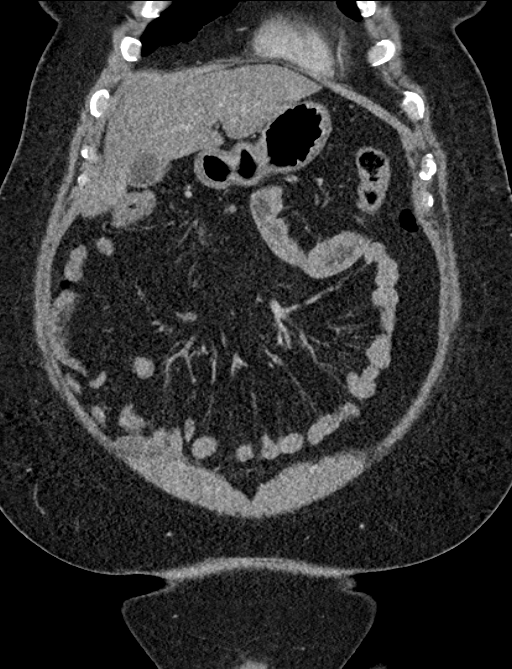
[im 51/114  soft-tissue]
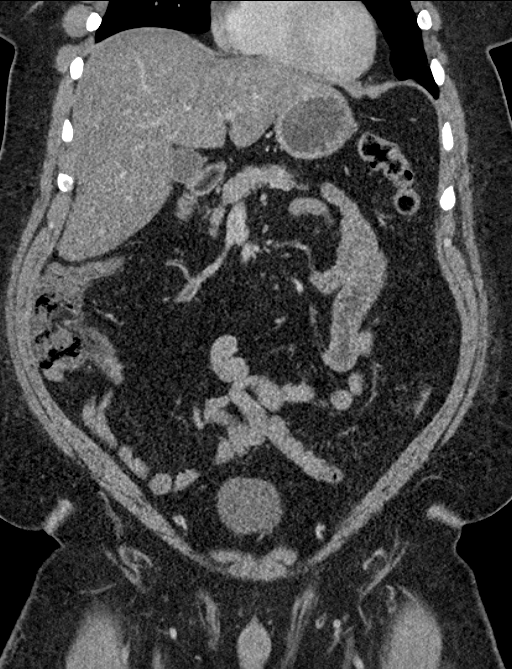
[im 63/114  soft-tissue]
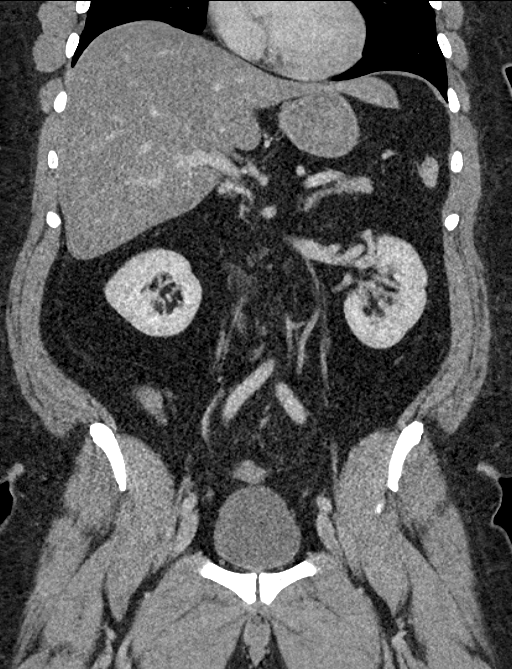

[16 of 46 positions shown; findings below may reference images not displayed]

FINDINGS: Lower chest: No acute abnormality.

Hepatobiliary: Steatosis. Possible gas containing gallstone.
Suggestion of minimal gallbladder wall thickening on coronal views.
No biliary dilatation

Pancreas: Unremarkable. No pancreatic ductal dilatation or
surrounding inflammatory changes.

Spleen: Normal in size without focal abnormality.

Adrenals/Urinary Tract: Adrenal glands are unremarkable. Kidneys are
normal, without renal calculi, focal lesion, or hydronephrosis.
Bladder is unremarkable.

Stomach/Bowel: Stomach is within normal limits. Status post
appendectomy. No evidence of bowel wall thickening, distention, or
inflammatory changes.

Vascular/Lymphatic: No significant vascular findings are present. No
enlarged abdominal or pelvic lymph nodes.

Reproductive: Prostate is unremarkable.

Other: No abdominal wall hernia or abnormality. No abdominopelvic
ascites.

Musculoskeletal: Trace retrolisthesis L4 on L5 with degenerative
changes. Grade 1 anterolisthesis L5 on S1 with chronic pars defect
at L5. Moderate-to-marked degenerative changes at L5-S1.
IMPRESSION: 1. Possible gas containing gallstone with suggestion of mild wall
thickening. Suggest correlation with right upper quadrant
ultrasound.
2. Hepatic steatosis

## 2019-02-10 ENCOUNTER — Ambulatory Visit
Admission: RE | Admit: 2019-02-10 | Discharge: 2019-02-10 | Disposition: A | Discharge status: Home / Self Care | Payer: BC Managed Care – PPO | Source: Ambulatory Visit | Attending: Physician Assistant | Admitting: Physician Assistant

## 2019-02-10 ENCOUNTER — Other Ambulatory Visit: Payer: Self-pay | Admitting: Physician Assistant

## 2019-02-10 DIAGNOSIS — R194 Change in bowel habit: Secondary | ICD-10-CM
